# Patient Record
Sex: Female | Born: 1980 | Race: Black or African American | Hispanic: No | Marital: Single | Smoking: Never smoker
Health system: Southern US, Community
[De-identification: ages and names within clinical notes are randomized; demographics above are authoritative.]

## PROBLEM LIST (undated history)

## (undated) HISTORY — PX: FOOT SURGERY: SHX648

---

## 2008-12-30 ENCOUNTER — Emergency Department (HOSPITAL_COMMUNITY): Admission: EM | Admit: 2008-12-30 | Discharge: 2008-12-30 | Payer: Self-pay | Admitting: Emergency Medicine

## 2008-12-31 ENCOUNTER — Ambulatory Visit (HOSPITAL_COMMUNITY): Admission: RE | Admit: 2008-12-31 | Discharge: 2008-12-31 | Payer: Self-pay | Admitting: Emergency Medicine

## 2009-03-03 ENCOUNTER — Emergency Department (HOSPITAL_COMMUNITY): Admission: EM | Admit: 2009-03-03 | Discharge: 2009-03-03 | Payer: Self-pay | Admitting: Emergency Medicine

## 2009-06-01 ENCOUNTER — Emergency Department (HOSPITAL_COMMUNITY): Admission: EM | Admit: 2009-06-01 | Discharge: 2009-06-01 | Payer: Self-pay | Admitting: Emergency Medicine

## 2009-07-25 ENCOUNTER — Emergency Department (HOSPITAL_COMMUNITY): Admission: EM | Admit: 2009-07-25 | Discharge: 2009-07-25 | Payer: Self-pay | Admitting: Emergency Medicine

## 2009-07-26 ENCOUNTER — Emergency Department (HOSPITAL_COMMUNITY): Admission: EM | Admit: 2009-07-26 | Discharge: 2009-07-26 | Payer: Self-pay | Admitting: Emergency Medicine

## 2009-10-05 ENCOUNTER — Emergency Department (HOSPITAL_COMMUNITY): Admission: EM | Admit: 2009-10-05 | Discharge: 2009-10-05 | Payer: Self-pay | Admitting: Emergency Medicine

## 2009-10-19 ENCOUNTER — Emergency Department (HOSPITAL_COMMUNITY): Admission: EM | Admit: 2009-10-19 | Discharge: 2009-10-20 | Payer: Self-pay | Admitting: Emergency Medicine

## 2009-10-21 ENCOUNTER — Emergency Department (HOSPITAL_COMMUNITY): Admission: EM | Admit: 2009-10-21 | Discharge: 2009-10-21 | Payer: Self-pay | Admitting: Emergency Medicine

## 2009-12-30 ENCOUNTER — Emergency Department (HOSPITAL_COMMUNITY): Admission: EM | Admit: 2009-12-30 | Discharge: 2009-12-30 | Payer: Self-pay | Admitting: Emergency Medicine

## 2010-01-13 ENCOUNTER — Emergency Department (HOSPITAL_COMMUNITY): Admission: EM | Admit: 2010-01-13 | Discharge: 2010-01-13 | Payer: Self-pay | Admitting: Emergency Medicine

## 2010-02-25 ENCOUNTER — Emergency Department (HOSPITAL_COMMUNITY): Admission: EM | Admit: 2010-02-25 | Discharge: 2010-02-25 | Payer: Self-pay | Admitting: Emergency Medicine

## 2010-02-27 ENCOUNTER — Emergency Department (HOSPITAL_COMMUNITY): Admission: EM | Admit: 2010-02-27 | Discharge: 2010-02-27 | Payer: Self-pay | Admitting: Emergency Medicine

## 2010-08-18 ENCOUNTER — Encounter: Payer: Self-pay | Admitting: Emergency Medicine

## 2010-10-10 LAB — POCT PREGNANCY, URINE: Preg Test, Ur: NEGATIVE

## 2010-10-12 LAB — DIFFERENTIAL
Basophils Absolute: 0.1 K/uL (ref 0.0–0.1)
Basophils Relative: 2 % — ABNORMAL HIGH (ref 0–1)
Eosinophils Absolute: 0.1 K/uL (ref 0.0–0.7)
Eosinophils Relative: 1 % (ref 0–5)
Lymphocytes Relative: 32 % (ref 12–46)
Lymphs Abs: 2.5 K/uL (ref 0.7–4.0)
Monocytes Absolute: 0.5 K/uL (ref 0.1–1.0)
Monocytes Relative: 6 % (ref 3–12)
Neutro Abs: 4.9 K/uL (ref 1.7–7.7)
Neutrophils Relative %: 60 % (ref 43–77)

## 2010-10-12 LAB — URINALYSIS, ROUTINE W REFLEX MICROSCOPIC
Bilirubin Urine: NEGATIVE
Ketones, ur: NEGATIVE mg/dL
Nitrite: NEGATIVE
Protein, ur: NEGATIVE mg/dL
Specific Gravity, Urine: 1.02 (ref 1.005–1.030)

## 2010-10-12 LAB — COMPREHENSIVE METABOLIC PANEL WITH GFR
ALT: 21 U/L (ref 0–35)
AST: 17 U/L (ref 0–37)
Albumin: 3.8 g/dL (ref 3.5–5.2)
Alkaline Phosphatase: 75 U/L (ref 39–117)
BUN: 9 mg/dL (ref 6–23)
CO2: 22 meq/L (ref 19–32)
Calcium: 9.3 mg/dL (ref 8.4–10.5)
Chloride: 105 meq/L (ref 96–112)
Creatinine, Ser: 0.63 mg/dL (ref 0.4–1.2)
GFR calc non Af Amer: >60 mL/min
Glucose, Bld: 82 mg/dL (ref 70–99)
Potassium: 3.7 meq/L (ref 3.5–5.1)
Sodium: 136 meq/L (ref 135–145)
Total Bilirubin: 0.6 mg/dL (ref 0.3–1.2)
Total Protein: 8.2 g/dL (ref 6.0–8.3)

## 2010-10-12 LAB — CBC
HCT: 41.2 % (ref 36.0–46.0)
Hemoglobin: 14 g/dL (ref 12.0–15.0)
MCHC: 34 g/dL (ref 30.0–36.0)
MCV: 87 fL (ref 78.0–100.0)
Platelets: 234 K/uL (ref 150–400)
RBC: 4.73 MIL/uL (ref 3.87–5.11)
RDW: 14.1 % (ref 11.5–15.5)
WBC: 8.1 K/uL (ref 4.0–10.5)

## 2010-10-12 LAB — PREGNANCY, URINE: Preg Test, Ur: NEGATIVE

## 2010-10-13 LAB — URINALYSIS, ROUTINE W REFLEX MICROSCOPIC
Bilirubin Urine: NEGATIVE
Nitrite: NEGATIVE
Protein, ur: NEGATIVE mg/dL
Specific Gravity, Urine: 1.02 (ref 1.005–1.030)
Urobilinogen, UA: 0.2 mg/dL (ref 0.0–1.0)
pH: 5.5 (ref 5.0–8.0)

## 2010-10-13 LAB — COMPREHENSIVE METABOLIC PANEL
ALT: 20 U/L (ref 0–35)
Alkaline Phosphatase: 86 U/L (ref 39–117)
BUN: 8 mg/dL (ref 6–23)
Calcium: 9.1 mg/dL (ref 8.4–10.5)
GFR calc non Af Amer: >60 mL/min (ref >60)
Total Bilirubin: 0.6 mg/dL (ref 0.3–1.2)
Total Protein: 7.9 g/dL (ref 6.0–8.3)

## 2010-10-13 LAB — CBC
HCT: 42.7 % (ref 36.0–46.0)
Hemoglobin: 14.6 g/dL (ref 12.0–15.0)
Platelets: 220 10*3/uL (ref 150–400)
RBC: 4.92 MIL/uL (ref 3.87–5.11)
RDW: 13.8 % (ref 11.5–15.5)
WBC: 6.5 10*3/uL (ref 4.0–10.5)

## 2010-10-13 LAB — DIFFERENTIAL
Basophils Absolute: 0.1 10*3/uL (ref 0.0–0.1)
Basophils Relative: 1 % (ref 0–1)
Eosinophils Absolute: 0 10*3/uL (ref 0.0–0.7)
Eosinophils Relative: 1 % (ref 0–5)
Lymphocytes Relative: 32 % (ref 12–46)
Lymphs Abs: 2.1 10*3/uL (ref 0.7–4.0)
Monocytes Absolute: 0.3 10*3/uL (ref 0.1–1.0)
Monocytes Relative: 5 % (ref 3–12)

## 2010-10-20 LAB — URINALYSIS, ROUTINE W REFLEX MICROSCOPIC
Bilirubin Urine: NEGATIVE
Nitrite: NEGATIVE

## 2010-10-20 LAB — PREGNANCY, URINE: Preg Test, Ur: NEGATIVE

## 2010-10-27 LAB — DIFFERENTIAL
Lymphs Abs: 1.7 10*3/uL (ref 0.7–4.0)
Monocytes Relative: 5 % (ref 3–12)
Neutro Abs: 3.5 10*3/uL (ref 1.7–7.7)
Neutrophils Relative %: 63 % (ref 43–77)

## 2010-10-27 LAB — URINALYSIS, ROUTINE W REFLEX MICROSCOPIC
Glucose, UA: NEGATIVE mg/dL
Nitrite: NEGATIVE
Protein, ur: NEGATIVE mg/dL
Specific Gravity, Urine: 1.02 (ref 1.005–1.030)
Urobilinogen, UA: 0.2 mg/dL (ref 0.0–1.0)
pH: 6.5 (ref 5.0–8.0)

## 2010-10-27 LAB — BASIC METABOLIC PANEL
Calcium: 9.3 mg/dL (ref 8.4–10.5)
Chloride: 106 mEq/L (ref 96–112)
Creatinine, Ser: 0.69 mg/dL (ref 0.4–1.2)
GFR calc non Af Amer: >60 mL/min (ref >60)
Potassium: 3.8 mEq/L (ref 3.5–5.1)
Sodium: 137 mEq/L (ref 135–145)

## 2010-10-27 LAB — CBC
MCV: 86.7 fL (ref 78.0–100.0)
RBC: 4.95 MIL/uL (ref 3.87–5.11)
WBC: 5.6 10*3/uL (ref 4.0–10.5)

## 2010-11-01 LAB — URINE MICROSCOPIC-ADD ON

## 2010-11-01 LAB — URINALYSIS, ROUTINE W REFLEX MICROSCOPIC
Glucose, UA: NEGATIVE mg/dL
pH: 6 (ref 5.0–8.0)

## 2010-11-01 LAB — BASIC METABOLIC PANEL
BUN: 6 mg/dL (ref 6–23)
Calcium: 8.7 mg/dL (ref 8.4–10.5)
GFR calc non Af Amer: >60 mL/min (ref >60)
Glucose, Bld: 103 mg/dL — ABNORMAL HIGH (ref 70–99)

## 2010-11-01 LAB — CBC
HCT: 39.5 % (ref 36.0–46.0)
Platelets: 221 10*3/uL (ref 150–400)
RDW: 13.4 % (ref 11.5–15.5)

## 2010-11-01 LAB — DIFFERENTIAL
Basophils Absolute: 0 10*3/uL (ref 0.0–0.1)
Eosinophils Relative: 1 % (ref 0–5)
Lymphocytes Relative: 30 % (ref 12–46)
Neutro Abs: 3.8 10*3/uL (ref 1.7–7.7)

## 2010-11-03 LAB — LIPASE, BLOOD: Lipase: 22 U/L (ref 11–59)

## 2010-11-03 LAB — COMPREHENSIVE METABOLIC PANEL
ALT: 24 U/L (ref 0–35)
AST: 21 U/L (ref 0–37)
Albumin: 3.8 g/dL (ref 3.5–5.2)
CO2: 24 mEq/L (ref 19–32)
Chloride: 108 mEq/L (ref 96–112)
GFR calc Af Amer: >60 mL/min (ref >60)
GFR calc non Af Amer: >60 mL/min (ref >60)
Potassium: 3.8 mEq/L (ref 3.5–5.1)
Sodium: 138 mEq/L (ref 135–145)
Total Bilirubin: 0.6 mg/dL (ref 0.3–1.2)

## 2010-11-03 LAB — DIFFERENTIAL
Basophils Absolute: 0 10*3/uL (ref 0.0–0.1)
Eosinophils Absolute: 0 10*3/uL (ref 0.0–0.7)
Eosinophils Relative: 0 % (ref 0–5)
Monocytes Absolute: 0.2 10*3/uL (ref 0.1–1.0)

## 2010-11-03 LAB — URINALYSIS, ROUTINE W REFLEX MICROSCOPIC
Bilirubin Urine: NEGATIVE
Ketones, ur: NEGATIVE mg/dL
Nitrite: NEGATIVE
Protein, ur: NEGATIVE mg/dL
Specific Gravity, Urine: 1.025 (ref 1.005–1.030)
Urobilinogen, UA: 0.2 mg/dL (ref 0.0–1.0)

## 2010-11-03 LAB — URINE CULTURE: Colony Count: >=100000

## 2010-11-03 LAB — CBC
RBC: 4.46 MIL/uL (ref 3.87–5.11)
WBC: 7.6 10*3/uL (ref 4.0–10.5)

## 2010-11-03 LAB — URINE MICROSCOPIC-ADD ON

## 2010-11-03 LAB — PREGNANCY, URINE: Preg Test, Ur: NEGATIVE

## 2010-11-16 ENCOUNTER — Emergency Department (HOSPITAL_COMMUNITY): Payer: Medicaid Other

## 2010-11-16 ENCOUNTER — Emergency Department (HOSPITAL_COMMUNITY)
Admission: EM | Admit: 2010-11-16 | Discharge: 2010-11-16 | Disposition: A | Discharge status: Home / Self Care | Payer: Medicaid Other | Attending: Emergency Medicine | Admitting: Emergency Medicine

## 2010-11-16 DIAGNOSIS — N898 Other specified noninflammatory disorders of vagina: Secondary | ICD-10-CM | POA: Insufficient documentation

## 2010-11-16 DIAGNOSIS — R1012 Left upper quadrant pain: Secondary | ICD-10-CM | POA: Insufficient documentation

## 2010-11-16 DIAGNOSIS — J45909 Unspecified asthma, uncomplicated: Secondary | ICD-10-CM | POA: Insufficient documentation

## 2010-11-16 DIAGNOSIS — R1032 Left lower quadrant pain: Secondary | ICD-10-CM | POA: Insufficient documentation

## 2010-11-16 DIAGNOSIS — G43909 Migraine, unspecified, not intractable, without status migrainosus: Secondary | ICD-10-CM | POA: Insufficient documentation

## 2010-11-16 LAB — WET PREP, GENITAL
Clue Cells Wet Prep HPF POC: NONE SEEN
Trich, Wet Prep: NONE SEEN
Yeast Wet Prep HPF POC: NONE SEEN

## 2010-11-16 LAB — COMPREHENSIVE METABOLIC PANEL
Alkaline Phosphatase: 82 U/L (ref 39–117)
BUN: 7 mg/dL (ref 6–23)
Calcium: 9.2 mg/dL (ref 8.4–10.5)
Creatinine, Ser: 0.65 mg/dL (ref 0.4–1.2)
Glucose, Bld: 97 mg/dL (ref 70–99)
Potassium: 3.9 mEq/L (ref 3.5–5.1)
Total Protein: 7.4 g/dL (ref 6.0–8.3)

## 2010-11-16 LAB — DIFFERENTIAL
Basophils Relative: 0 % (ref 0–1)
Eosinophils Absolute: 0.1 10*3/uL (ref 0.0–0.7)
Eosinophils Relative: 1 % (ref 0–5)
Lymphs Abs: 2.6 10*3/uL (ref 0.7–4.0)
Monocytes Relative: 8 % (ref 3–12)
Neutrophils Relative %: 56 % (ref 43–77)

## 2010-11-16 LAB — URINALYSIS, ROUTINE W REFLEX MICROSCOPIC
Bilirubin Urine: NEGATIVE
Nitrite: NEGATIVE
Specific Gravity, Urine: 1.02 (ref 1.005–1.030)
Urobilinogen, UA: 0.2 mg/dL (ref 0.0–1.0)
pH: 6 (ref 5.0–8.0)

## 2010-11-16 LAB — URINE MICROSCOPIC-ADD ON

## 2010-11-16 LAB — PREGNANCY, URINE: Preg Test, Ur: NEGATIVE

## 2010-11-16 LAB — CBC
MCH: 29.3 pg (ref 26.0–34.0)
MCHC: 33.8 g/dL (ref 30.0–36.0)
MCV: 86.5 fL (ref 78.0–100.0)
Platelets: 256 10*3/uL (ref 150–400)
RBC: 4.68 MIL/uL (ref 3.87–5.11)
RDW: 14.1 % (ref 11.5–15.5)

## 2010-11-16 LAB — RPR: RPR Ser Ql: NONREACTIVE

## 2011-06-20 ENCOUNTER — Encounter: Payer: Self-pay | Admitting: *Deleted

## 2011-06-20 ENCOUNTER — Emergency Department (HOSPITAL_COMMUNITY)
Admission: EM | Admit: 2011-06-20 | Discharge: 2011-06-20 | Disposition: A | Discharge status: Home / Self Care | Payer: Worker's Compensation | Attending: Emergency Medicine | Admitting: Emergency Medicine

## 2011-06-20 DIAGNOSIS — M545 Low back pain, unspecified: Secondary | ICD-10-CM | POA: Insufficient documentation

## 2011-06-20 DIAGNOSIS — J45909 Unspecified asthma, uncomplicated: Secondary | ICD-10-CM | POA: Insufficient documentation

## 2011-06-20 DIAGNOSIS — X500XXA Overexertion from strenuous movement or load, initial encounter: Secondary | ICD-10-CM | POA: Insufficient documentation

## 2011-06-20 DIAGNOSIS — Y9269 Other specified industrial and construction area as the place of occurrence of the external cause: Secondary | ICD-10-CM | POA: Insufficient documentation

## 2011-06-20 MED ORDER — CYCLOBENZAPRINE HCL 5 MG PO TABS: 5.0000 mg | ORAL_TABLET | Freq: Three times a day (TID) | ORAL | Status: AC | PRN

## 2011-06-20 MED ORDER — HYDROCODONE-ACETAMINOPHEN 5-325 MG PO TABS: 2.0000 | ORAL_TABLET | ORAL | Status: AC | PRN

## 2011-06-20 NOTE — ED Provider Notes (Signed)
History     CSN: [id number] Arrival date & time: 06/20/2011 12:34 PM   None     Chief Complaint  Patient presents with  . Back Pain    HPI Kerri Fisher is a 30 y.o. female who presents to the ED for back pain. 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  Continued to work went home and began feeling low back pain so took a warm shower and used a heating pad that helped and took ibuprofen. Today went back to work and had the same resident and after trying to turn her began having sharp pain in lower back. Denies loss of control of bladder or bowels. The history was provided by the patient.   Past Medical History  Diagnosis Date  . Asthma   . Migraine     History reviewed. No pertinent past surgical history.  History reviewed. No pertinent family history.  History  Substance Use Topics  . Smoking status: Never Smoker   . Smokeless tobacco: Not on file  . Alcohol Use: No    OB History    Grav Para Term Preterm Abortions TAB SAB Ect Mult Living                  Review of Systems  Musculoskeletal: Positive for back pain.  All other systems reviewed and are negative.    Allergies  Penicillins  Home Medications  No current outpatient prescriptions on file.  BP 127/75  Pulse 92  Temp(Src) 97.7 F (36.5 C) (Oral)  Resp 20  Ht 5\' 6"  (1.676 m)  Wt 234 lb (106.142 kg)  BMI 37.77 kg/m2  SpO2 99%  Physical Exam  Nursing note and vitals reviewed. Constitutional: She is oriented to person, place, and time. She appears well-developed and well-nourished. No distress.  HENT:  Head: Normocephalic and atraumatic.  Eyes: EOM are normal.  Neck: Neck supple.  Cardiovascular: Normal rate.   Pulmonary/Chest: Effort normal.  Abdominal: Soft. There is no tenderness.  Musculoskeletal:       Pain in lower lumbar area with ROM. Pain is bilateral and radiates to both legs.  Reflexes are equal in lower extremities. Pedal pulses +. Normal gait. Good grips and equal bilateral.  Neurological: She is alert and oriented to person, place, and time. No cranial nerve deficit.  Skin: Skin is warm and dry.  Psychiatric: She has a normal mood and affect. Her behavior is normal. Judgment and thought content normal.   Assessment: Low back pain  Plan:  Rest, heat, ibuprofen, flexeril and vicodin   Follow up with Dr. Romeo Apple   Return here as needed. ED Course  Procedures    MDM          Kerrie Buffalo, NP 06/20/11 1404

## 2011-06-20 NOTE — ED Notes (Signed)
Pt states she was at work yesterday and working with a pt and felt a pop in her lower back. Pt states she kept working and last night began to feel worse. Pt c/o sharp pain today.

## 2011-06-20 NOTE — ED Notes (Signed)
Pt a/ox4. Resp even and unlabored. NAD at this time. D/C instructions and Rx x2 reviewed with pt. Pt verbalized understanding. Pt ambulated to lobby with steady gate.  

## 2011-06-21 NOTE — ED Provider Notes (Signed)
Medical screening examination/treatment/procedure(s) were performed by non-physician practitioner and as supervising physician I was immediately available for consultation/collaboration.  Nicoletta Dress. Colon Branch, MD 06/21/11 1215

## 2011-07-02 ENCOUNTER — Emergency Department (HOSPITAL_COMMUNITY): Payer: Medicaid Other

## 2011-07-02 ENCOUNTER — Emergency Department (HOSPITAL_COMMUNITY)
Admission: EM | Admit: 2011-07-02 | Discharge: 2011-07-02 | Disposition: A | Discharge status: Home / Self Care | Payer: Medicaid Other | Attending: Emergency Medicine | Admitting: Emergency Medicine

## 2011-07-02 ENCOUNTER — Encounter (HOSPITAL_COMMUNITY): Payer: Self-pay | Admitting: Emergency Medicine

## 2011-07-02 DIAGNOSIS — R1013 Epigastric pain: Secondary | ICD-10-CM | POA: Insufficient documentation

## 2011-07-02 DIAGNOSIS — K5289 Other specified noninfective gastroenteritis and colitis: Secondary | ICD-10-CM | POA: Insufficient documentation

## 2011-07-02 DIAGNOSIS — K529 Noninfective gastroenteritis and colitis, unspecified: Secondary | ICD-10-CM

## 2011-07-02 DIAGNOSIS — J45909 Unspecified asthma, uncomplicated: Secondary | ICD-10-CM | POA: Insufficient documentation

## 2011-07-02 DIAGNOSIS — G43909 Migraine, unspecified, not intractable, without status migrainosus: Secondary | ICD-10-CM | POA: Insufficient documentation

## 2011-07-02 LAB — URINALYSIS, ROUTINE W REFLEX MICROSCOPIC
Glucose, UA: NEGATIVE mg/dL
Hgb urine dipstick: NEGATIVE
Protein, ur: NEGATIVE mg/dL

## 2011-07-02 LAB — DIFFERENTIAL
Lymphs Abs: 1.2 10*3/uL (ref 0.7–4.0)
Monocytes Relative: 6 % (ref 3–12)
Neutro Abs: 4.6 10*3/uL (ref 1.7–7.7)
Neutrophils Relative %: 74 % (ref 43–77)

## 2011-07-02 LAB — CBC
Hemoglobin: 14.3 g/dL (ref 12.0–15.0)
RBC: 4.81 MIL/uL (ref 3.87–5.11)

## 2011-07-02 LAB — COMPREHENSIVE METABOLIC PANEL
Alkaline Phosphatase: 96 U/L (ref 39–117)
BUN: 8 mg/dL (ref 6–23)
Chloride: 103 mEq/L (ref 96–112)
GFR calc Af Amer: >90 mL/min (ref >90)
Glucose, Bld: 126 mg/dL — ABNORMAL HIGH (ref 70–99)
Potassium: 3.7 mEq/L (ref 3.5–5.1)
Total Bilirubin: 0.3 mg/dL (ref 0.3–1.2)

## 2011-07-02 LAB — PREGNANCY, URINE: Preg Test, Ur: NEGATIVE

## 2011-07-02 LAB — POCT PREGNANCY, URINE: Preg Test, Ur: NEGATIVE

## 2011-07-02 LAB — LIPASE, BLOOD: Lipase: 25 U/L (ref 11–59)

## 2011-07-02 MED ORDER — DIPHENOXYLATE-ATROPINE 2.5-0.025 MG PO TABS: 1.0000 | ORAL_TABLET | Freq: Four times a day (QID) | ORAL | Status: AC | PRN

## 2011-07-02 MED ORDER — PANTOPRAZOLE SODIUM 40 MG IV SOLR
40.0000 mg | Freq: Once | INTRAVENOUS | Status: AC
  Administered 2011-07-02: 40 mg via INTRAVENOUS
  Filled 2011-07-02: qty 40

## 2011-07-02 MED ORDER — MORPHINE SULFATE 4 MG/ML IJ SOLN
4.0000 mg | Freq: Once | INTRAMUSCULAR | Status: AC
  Administered 2011-07-02: 4 mg via INTRAVENOUS
  Filled 2011-07-02: qty 1

## 2011-07-02 MED ORDER — SODIUM CHLORIDE 0.9 % IV SOLN
INTRAVENOUS | Status: DC
  Administered 2011-07-02: 11:00:00 via INTRAVENOUS

## 2011-07-02 MED ORDER — ONDANSETRON HCL 4 MG/2ML IJ SOLN
4.0000 mg | Freq: Once | INTRAMUSCULAR | Status: AC
  Administered 2011-07-02: 4 mg via INTRAVENOUS
  Filled 2011-07-02: qty 2

## 2011-07-02 MED ORDER — SODIUM CHLORIDE 0.9 % IV BOLUS (SEPSIS)
1000.0000 mL | Freq: Once | INTRAVENOUS | Status: AC
  Administered 2011-07-02: 1000 mL via INTRAVENOUS

## 2011-07-02 MED ORDER — MORPHINE SULFATE 4 MG/ML IJ SOLN: 4.0000 mg | Freq: Once | INTRAMUSCULAR | Status: DC

## 2011-07-02 MED ORDER — HYDROCODONE-ACETAMINOPHEN 5-325 MG PO TABS: 1.0000 | ORAL_TABLET | ORAL | Status: DC | PRN

## 2011-07-02 MED ORDER — ONDANSETRON HCL 4 MG PO TABS: 4.0000 mg | ORAL_TABLET | Freq: Four times a day (QID) | ORAL | Status: AC

## 2011-07-02 MED ORDER — IOHEXOL 300 MG/ML  SOLN
100.0000 mL | Freq: Once | INTRAMUSCULAR | Status: AC | PRN
  Administered 2011-07-02: 100 mL via INTRAVENOUS

## 2011-07-02 NOTE — ED Notes (Signed)
Received report from Tarri Glenn, RN

## 2011-07-02 NOTE — ED Notes (Signed)
Patient requesting something light to each "crackers" advised will need to check with MD

## 2011-07-02 NOTE — ED Provider Notes (Signed)
This chart was scribed for Donnetta Hutching, MD by Wallis Mart. The patient was seen in room APA04/APA04 and the patient's care was started at 7:44AM.   CSN: [id number] Arrival date & time: 07/02/2011  6:33 AM   None     Chief Complaint  Patient presents with  . Emesis    (Consider location/radiation/quality/duration/timing/severity/associated sxs/prior treatment) Patient is a 30 y.o. female presenting with vomiting.  Emesis   Kerri Fisher is a 30 y.o. female who presents to the Emergency Department complaining of constant moderate abdominal pain located in the epigastrium.  Pt reports that the pain started yesterday around 4:30 pm while putting up the Christmas tree at her home.  Pt c/o associated diarrhea, vomiting, HA and trouble sleeping.  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  Pt reports that she ate shrimp at a work reception yesterday afternoon.         Past Medical History  Diagnosis Date  . Asthma   . Migraine     History reviewed. No pertinent past surgical history.  No family history on file.  History  Substance Use Topics  . Smoking status: Never Smoker   . Smokeless tobacco: Not on file  . Alcohol Use: No    OB History    Grav Para Term Preterm Abortions TAB SAB Ect Mult Living                  Review of Systems  Gastrointestinal: Positive for vomiting.  10 Systems reviewed and are negative for acute change except as noted in the HPI.   Allergies  Penicillins  Home Medications   Current Outpatient Rx  Name Route Sig Dispense Refill  . ALBUTEROL SULFATE HFA 108 (90 BASE) MCG/ACT IN AERS Inhalation Inhale 1 puff into the lungs every 6 (six) hours as needed. For asthma     . CYCLOBENZAPRINE HCL 10 MG PO TABS Oral Take 10 mg by mouth at bedtime.      Marland Kitchen HYDROCODONE-ACETAMINOPHEN 5-325 MG PO TABS Oral Take 1 tablet by mouth every 6 (six) hours as needed.      Marland Kitchen MEDROXYPROGESTERONE ACETATE 150 MG/ML IM SUSP Intramuscular Inject  150 mg into the muscle every 3 (three) months.        BP 103/68  Pulse 94  Temp(Src) 97.4 F (36.3 C) (Oral)  Resp 20  Ht 5\' 6"  (1.676 m)  Wt 230 lb (104.327 kg)  BMI 37.12 kg/m2  SpO2 100%  Physical Exam  Nursing note and vitals reviewed. Constitutional: She is oriented to person, place, and time. She appears well-developed and well-nourished. No distress.  HENT:  Head: Normocephalic and atraumatic.  Eyes: EOM are normal. Pupils are equal, round, and reactive to light.  Neck: Neck supple. No tracheal deviation present.  Cardiovascular: Normal rate and regular rhythm.   No murmur heard. Pulmonary/Chest: Effort normal. No respiratory distress. She has no wheezes.  Abdominal: Soft. She exhibits no distension. There is tenderness.       Minimal tenderenss in upper epigastrium   Musculoskeletal: Normal range of motion. She exhibits no edema.  Neurological: She is alert and oriented to person, place, and time. No sensory deficit.  Skin: Skin is warm and dry.  Psychiatric: She has a normal mood and affect. Her behavior is normal.    ED Course  Procedures (including critical care time)  DIAGNOSTIC STUDIES: Oxygen Saturation is 100% on room air, normal by my interpretation.    COORDINATION OF  CARE: 7:49AM- Discussed the treatment plan with patient which will consist of IVFs.  Labs Reviewed  COMPREHENSIVE METABOLIC PANEL - Abnormal; Notable for the following:    Glucose, Bld 126 (*)    All other components within normal limits  URINALYSIS, ROUTINE W REFLEX MICROSCOPIC  PREGNANCY, URINE  POCT PREGNANCY, URINE  CBC  DIFFERENTIAL  LIPASE, BLOOD   No results found.   No diagnosis found.  Results for orders placed during the hospital encounter of 07/02/11  URINALYSIS, ROUTINE W REFLEX MICROSCOPIC      Component Value Range   Color, Urine YELLOW  YELLOW    APPearance CLEAR  CLEAR    Specific Gravity, Urine 1.020  1.005 - 1.030    pH 6.0  5.0 - 8.0    Glucose, UA  NEGATIVE  NEGATIVE (mg/dL)   Hgb urine dipstick NEGATIVE  NEGATIVE    Bilirubin Urine NEGATIVE  NEGATIVE    Ketones, ur NEGATIVE  NEGATIVE (mg/dL)   Protein, ur NEGATIVE  NEGATIVE (mg/dL)   Urobilinogen, UA 0.2  0.0 - 1.0 (mg/dL)   Nitrite NEGATIVE  NEGATIVE    Leukocytes, UA NEGATIVE  NEGATIVE   PREGNANCY, URINE      Component Value Range   Preg Test, Ur NEGATIVE    POCT PREGNANCY, URINE      Component Value Range   Preg Test, Ur NEGATIVE    CBC      Component Value Range   WBC 6.2  4.0 - 10.5 (K/uL)   RBC 4.81  3.87 - 5.11 (MIL/uL)   Hemoglobin 14.3  12.0 - 15.0 (g/dL)   HCT 16.1  09.6 - 04.5 (%)   MCV 86.7  78.0 - 100.0 (fL)   MCH 29.7  26.0 - 34.0 (pg)   MCHC 34.3  30.0 - 36.0 (g/dL)   RDW 40.9  81.1 - 91.4 (%)   Platelets 259  150 - 400 (K/uL)  DIFFERENTIAL      Component Value Range   Neutrophils Relative 74  43 - 77 (%)   Neutro Abs 4.6  1.7 - 7.7 (K/uL)   Lymphocytes Relative 19  12 - 46 (%)   Lymphs Abs 1.2  0.7 - 4.0 (K/uL)   Monocytes Relative 6  3 - 12 (%)   Monocytes Absolute 0.4  0.1 - 1.0 (K/uL)   Eosinophils Relative 1  0 - 5 (%)   Eosinophils Absolute 0.1  0.0 - 0.7 (K/uL)   Basophils Relative 0  0 - 1 (%)   Basophils Absolute 0.0  0.0 - 0.1 (K/uL)  COMPREHENSIVE METABOLIC PANEL      Component Value Range   Sodium 137  135 - 145 (mEq/L)   Potassium 3.7  3.5 - 5.1 (mEq/L)   Chloride 103  96 - 112 (mEq/L)   CO2 26  19 - 32 (mEq/L)   Glucose, Bld 126 (*) 70 - 99 (mg/dL)   BUN 8  6 - 23 (mg/dL)   Creatinine, Ser 7.82  0.50 - 1.10 (mg/dL)   Calcium 9.2  8.4 - 95.6 (mg/dL)   Total Protein 7.8  6.0 - 8.3 (g/dL)   Albumin 3.5  3.5 - 5.2 (g/dL)   AST 14  0 - 37 (U/L)   ALT 22  0 - 35 (U/L)   Alkaline Phosphatase 96  39 - 117 (U/L)   Total Bilirubin 0.3  0.3 - 1.2 (mg/dL)   GFR calc non Af Amer >90  >90 (mL/min)   GFR calc Af Amer >90  >  90 (mL/min)  LIPASE, BLOOD      Component Value Range   Lipase 25  11 - 59 (U/L)   Ct Abdomen Pelvis W  Contrast  07/02/2011  *RADIOLOGY REPORT*  Clinical Data: Persistent epigastric pain  CT ABDOMEN AND PELVIS WITH CONTRAST  Technique:  Multidetector CT imaging of the abdomen and pelvis was performed following the standard protocol during bolus administration of intravenous contrast.  Contrast: OMNIPAQUE IOHEXOL 300 MG/ML IV SOLN  Comparison: CT abdomen 01/13/2010  Findings: Lung bases are clear.  No pericardial fluid.  No focal hepatic lesion.  The gallbladder, pancreas, spleen, and adrenal glands are normal.  There are bilateral nephrolithiasis.  The stones measure approximately 3 mm each and number approximately 6 to 8 stones within each kidney.  No evidence of ureterolithiasis. Calcifications  adjacent to the distal ureters are unchanged from prior felt to be external to the ureters.  The number of calculi in the kidneys may be slightly increased.  The stomach, small bowel, appendix, and colon are normal.  Abdominal aorta is normal caliber.  No retroperitoneal or periportal lymphadenopathy.  No free fluid the pelvis.  The bladder is normal.  Bladder stones. No pelvic lymphadenopathy. Uterus is normal.  No evidence of axillary, supraclavicular, mediastinal, or hilar lymphadenopathy.  IMPRESSION:  1.  No acute abdominal or pelvic findings. 2.  Normal appendix. 3.  Bilateral nephrolithiasis which is increased from prior.  No ureterolithiasis or obstructive uropathy.  Original Report Authenticated By: Genevive Bi, M.D.     MDM  History and physical most consistent with gastroenteritis.  No acute abdomen. CT scan shows no acute anomaly.  Patient feeling better after hydration, pain meds, Zofran     I personally performed the services described in this documentation, which was scribed in my presence. The recorded information has been reviewed and considered.     Donnetta Hutching, MD 07/02/11 [phone]

## 2011-07-02 NOTE — ED Notes (Signed)
x2 unsuccessful IV attempts made. Kendal Hymen RN to room to try for IV access.

## 2011-07-02 NOTE — ED Notes (Signed)
Patient c/o vomiting since yesterday.

## 2011-07-11 ENCOUNTER — Encounter (HOSPITAL_COMMUNITY): Payer: Self-pay | Admitting: Emergency Medicine

## 2011-07-11 ENCOUNTER — Emergency Department (HOSPITAL_COMMUNITY)
Admission: EM | Admit: 2011-07-11 | Discharge: 2011-07-11 | Disposition: A | Discharge status: Home / Self Care | Payer: Medicaid Other | Attending: Emergency Medicine | Admitting: Emergency Medicine

## 2011-07-11 ENCOUNTER — Emergency Department (HOSPITAL_COMMUNITY): Payer: Medicaid Other

## 2011-07-11 DIAGNOSIS — R197 Diarrhea, unspecified: Secondary | ICD-10-CM | POA: Insufficient documentation

## 2011-07-11 DIAGNOSIS — R109 Unspecified abdominal pain: Secondary | ICD-10-CM | POA: Insufficient documentation

## 2011-07-11 DIAGNOSIS — R112 Nausea with vomiting, unspecified: Secondary | ICD-10-CM | POA: Insufficient documentation

## 2011-07-11 DIAGNOSIS — J45909 Unspecified asthma, uncomplicated: Secondary | ICD-10-CM | POA: Insufficient documentation

## 2011-07-11 LAB — POCT PREGNANCY, URINE: Preg Test, Ur: NEGATIVE

## 2011-07-11 LAB — URINALYSIS, ROUTINE W REFLEX MICROSCOPIC
Bilirubin Urine: NEGATIVE
Glucose, UA: NEGATIVE mg/dL
Hgb urine dipstick: NEGATIVE
Ketones, ur: NEGATIVE mg/dL
Protein, ur: NEGATIVE mg/dL
pH: 6.5 (ref 5.0–8.0)

## 2011-07-11 LAB — COMPREHENSIVE METABOLIC PANEL
Albumin: 3.3 g/dL — ABNORMAL LOW (ref 3.5–5.2)
Alkaline Phosphatase: 97 U/L (ref 39–117)
BUN: 8 mg/dL (ref 6–23)
Chloride: 104 mEq/L (ref 96–112)
Creatinine, Ser: 0.64 mg/dL (ref 0.50–1.10)
GFR calc Af Amer: >90 mL/min (ref >90)
Glucose, Bld: 102 mg/dL — ABNORMAL HIGH (ref 70–99)
Potassium: 3.7 mEq/L (ref 3.5–5.1)
Total Bilirubin: 0.3 mg/dL (ref 0.3–1.2)
Total Protein: 8.1 g/dL (ref 6.0–8.3)

## 2011-07-11 LAB — DIFFERENTIAL
Basophils Relative: 0 % (ref 0–1)
Eosinophils Absolute: 0.2 10*3/uL (ref 0.0–0.7)
Lymphs Abs: 2.4 10*3/uL (ref 0.7–4.0)
Monocytes Absolute: 0.5 10*3/uL (ref 0.1–1.0)
Monocytes Relative: 8 % (ref 3–12)
Neutrophils Relative %: 53 % (ref 43–77)

## 2011-07-11 LAB — CBC
HCT: 41.4 % (ref 36.0–46.0)
Hemoglobin: 14.2 g/dL (ref 12.0–15.0)
MCH: 29.6 pg (ref 26.0–34.0)
MCHC: 34.3 g/dL (ref 30.0–36.0)
RBC: 4.79 MIL/uL (ref 3.87–5.11)

## 2011-07-11 LAB — URINE MICROSCOPIC-ADD ON

## 2011-07-11 MED ORDER — ACETAMINOPHEN 500 MG PO TABS
1000.0000 mg | ORAL_TABLET | Freq: Once | ORAL | Status: AC
  Administered 2011-07-11: 1000 mg via ORAL
  Filled 2011-07-11: qty 2

## 2011-07-11 MED ORDER — PROMETHAZINE HCL 25 MG PO TABS: 25.0000 mg | ORAL_TABLET | Freq: Four times a day (QID) | ORAL | Status: AC | PRN

## 2011-07-11 MED ORDER — ONDANSETRON HCL 4 MG/2ML IJ SOLN
4.0000 mg | INTRAMUSCULAR | Status: DC | PRN
  Administered 2011-07-11: 4 mg via INTRAVENOUS
  Filled 2011-07-11 (×2): qty 2

## 2011-07-11 MED ORDER — FAMOTIDINE IN NACL 20-0.9 MG/50ML-% IV SOLN
20.0000 mg | Freq: Once | INTRAVENOUS | Status: AC
  Administered 2011-07-11: 20 mg via INTRAVENOUS
  Filled 2011-07-11: qty 50

## 2011-07-11 MED ORDER — SODIUM CHLORIDE 0.9 % IV SOLN
INTRAVENOUS | Status: DC
  Administered 2011-07-11: 1000 mL via INTRAVENOUS

## 2011-07-11 MED ORDER — GI COCKTAIL ~~LOC~~
30.0000 mL | Freq: Once | ORAL | Status: AC
  Administered 2011-07-11: 30 mL via ORAL
  Filled 2011-07-11: qty 30

## 2011-07-11 MED ORDER — IBUPROFEN 400 MG PO TABS
400.0000 mg | ORAL_TABLET | Freq: Once | ORAL | Status: AC
  Administered 2011-07-11: 400 mg via ORAL
  Filled 2011-07-11: qty 1

## 2011-07-11 NOTE — ED Notes (Signed)
Patient with no complaints at this time. Respirations even and unlabored. Skin warm/dry. Discharge instructions reviewed with patient at this time. Patient given opportunity to voice concerns/ask questions. IV removed per policy and band-aid applied to site. Patient discharged at this time and left Emergency Department with steady gait.  

## 2011-07-11 NOTE — ED Notes (Signed)
Pt c/o upper abd pain with n/v/d since Thursday.

## 2011-07-11 NOTE — ED Notes (Signed)
Pt states that the nausea is better but the pain is the same. Pt rates the pain at a 9/10. RN made aware. NAD noted at this time. Vital signs WNL.

## 2011-07-11 NOTE — ED Provider Notes (Signed)
This chart was scribed for Kerri Anger, DO by Wallis Mart. The patient was seen in room APA05/APA05 and the patient's care was started at 8:59 AM.    CSN: [id number] Arrival date & time: 07/11/2011  8:32 AM   Chief Complaint  Patient presents with  . Abdominal Pain  . Nausea  . Emesis  . Diarrhea    HPI Pt seen at 8:59 AM Kerri Fisher is a 30 y.o. female who presents to the Emergency Department complaining of gradual onset and persistence of constant upper abodminal "pain" x2 days.  Has been associated with several intermittent episodes of N/V/D.  Pt was recently eval in the ED 2 weeks ago for same symptoms, labs and CT A/P neg for acute process.  Pt states her symptoms improved from that ED visit but have returned.  Denies fevers, no rash, no back pain, no black or blood in stools or emesis, no CP/SOB.    Past Medical History  Diagnosis Date  . Asthma   . Migraine     History reviewed. No pertinent past surgical history.   History  Substance Use Topics  . Smoking status: Never Smoker   . Smokeless tobacco: Not on file  . Alcohol Use: No   Review of Systems ROS: Statement: All systems negative except as marked or noted in the HPI; Constitutional: Negative for fever and chills. ; ; Eyes: Negative for eye pain, redness and discharge. ; ; ENMT: Negative for ear pain, hoarseness, nasal congestion, sinus pressure and sore throat. ; ; Cardiovascular: Negative for chest pain, palpitations, diaphoresis, dyspnea and peripheral edema. ; ; Respiratory: Negative for cough, wheezing and stridor. ; ; Gastrointestinal: +nausea, vomiting, diarrhea, abdominal pain; negative for blood in stool, hematemesis, jaundice and rectal bleeding. . ; ; Genitourinary: Negative for dysuria, flank pain and hematuria. ; ; Musculoskeletal: Negative for back pain and neck pain. Negative for swelling and trauma.; ; Skin: Negative for pruritus, rash, abrasions, blisters, bruising and skin lesion.; ;  Neuro: Negative for headache, lightheadedness and neck stiffness. Negative for weakness, altered level of consciousness , altered mental status, extremity weakness, paresthesias, involuntary movement, seizure and syncope.     Allergies  Tomato; Food; Peanut-containing drug products; and Penicillins  Home Medications   Current Outpatient Rx  Name Route Sig Dispense Refill  . ALBUTEROL SULFATE HFA 108 (90 BASE) MCG/ACT IN AERS Inhalation Inhale 1 puff into the lungs every 6 (six) hours as needed. For asthma    . B-12 2000 MCG PO TABS Oral Take 1 tablet by mouth every morning.      . CYCLOBENZAPRINE HCL 10 MG PO TABS Oral Take 10 mg by mouth at bedtime.     Marland Kitchen DIPHENOXYLATE-ATROPINE 2.5-0.025 MG PO TABS Oral Take 1 tablet by mouth 4 (four) times daily as needed for diarrhea or loose stools. 15 tablet 0  . HYDROCODONE-ACETAMINOPHEN 5-325 MG PO TABS Oral Take 1-2 tablets by mouth every 4 (four) hours as needed. For pain    . HYDROCODONE-ACETAMINOPHEN 5-325 MG PO TABS Oral Take 1-2 tablets by mouth every 4 (four) hours as needed for pain. 15 tablet 0  . MEDROXYPROGESTERONE ACETATE 150 MG/ML IM SUSP Intramuscular Inject 150 mg into the muscle every 3 (three) months.      Carma Leaven M PLUS PO TABS Oral Take 1 tablet by mouth daily.        BP 118/68  Pulse 106  Temp(Src) 97.7 F (36.5 C) (Oral)  Resp 21  Ht 5\' 6"  (  1.676 m)  Wt 232 lb (105.235 kg)  BMI 37.45 kg/m2  SpO2 98%  Physical Exam 0900: Physical examination:  Nursing notes reviewed; Vital signs and O2 SAT reviewed;  Constitutional: Well developed, Well nourished, Well hydrated, In no acute distress; Head:  Normocephalic, atraumatic; Eyes: EOMI, PERRL, No scleral icterus; ENMT: Mouth and pharynx normal, Mucous membranes moist; Neck: Supple, Full range of motion, No lymphadenopathy; Cardiovascular: Regular rate and rhythm, No murmur, rub, or gallop; Respiratory: Breath sounds clear & equal bilaterally, No rales, rhonchi, wheezes, or rub,  Normal respiratory effort/excursion; Chest: Nontender, Movement normal; Abdomen: Soft, +mid-epigastric area tender to palp.  No rebound or guarding, Nondistended, Normal bowel sounds; Extremities: Pulses normal, No tenderness, No edema, No calf edema or asymmetry.; Neuro: AA&Ox3, Major CN grossly intact.  No gross focal motor or sensory deficits in extremities.; Skin: Color normal, Warm, Dry.    ED Course  Procedures   MDM  MDM Reviewed: previous chart, nursing note and vitals Reviewed previous: CT scan and labs Interpretation: labs and x-ray   Results for orders placed during the hospital encounter of 07/11/11  URINALYSIS, ROUTINE W REFLEX MICROSCOPIC      Component Value Range   Color, Urine YELLOW  YELLOW    APPearance CLEAR  CLEAR    Specific Gravity, Urine 1.010  1.005 - 1.030    pH 6.5  5.0 - 8.0    Glucose, UA NEGATIVE  NEGATIVE (mg/dL)   Hgb urine dipstick NEGATIVE  NEGATIVE    Bilirubin Urine NEGATIVE  NEGATIVE    Ketones, ur NEGATIVE  NEGATIVE (mg/dL)   Protein, ur NEGATIVE  NEGATIVE (mg/dL)   Urobilinogen, UA 0.2  0.0 - 1.0 (mg/dL)   Nitrite NEGATIVE  NEGATIVE    Leukocytes, UA TRACE (*) NEGATIVE   LIPASE, BLOOD      Component Value Range   Lipase 26  11 - 59 (U/L)  CBC      Component Value Range   WBC 6.7  4.0 - 10.5 (K/uL)   RBC 4.79  3.87 - 5.11 (MIL/uL)   Hemoglobin 14.2  12.0 - 15.0 (g/dL)   HCT 16.1  09.6 - 04.5 (%)   MCV 86.4  78.0 - 100.0 (fL)   MCH 29.6  26.0 - 34.0 (pg)   MCHC 34.3  30.0 - 36.0 (g/dL)   RDW 40.9  81.1 - 91.4 (%)   Platelets 269  150 - 400 (K/uL)  DIFFERENTIAL      Component Value Range   Neutrophils Relative 53  43 - 77 (%)   Neutro Abs 3.6  1.7 - 7.7 (K/uL)   Lymphocytes Relative 36  12 - 46 (%)   Lymphs Abs 2.4  0.7 - 4.0 (K/uL)   Monocytes Relative 8  3 - 12 (%)   Monocytes Absolute 0.5  0.1 - 1.0 (K/uL)   Eosinophils Relative 2  0 - 5 (%)   Eosinophils Absolute 0.2  0.0 - 0.7 (K/uL)   Basophils Relative 0  0 - 1 (%)    Basophils Absolute 0.0  0.0 - 0.1 (K/uL)  COMPREHENSIVE METABOLIC PANEL      Component Value Range   Sodium 136  135 - 145 (mEq/L)   Potassium 3.7  3.5 - 5.1 (mEq/L)   Chloride 104  96 - 112 (mEq/L)   CO2 24  19 - 32 (mEq/L)   Glucose, Bld 102 (*) 70 - 99 (mg/dL)   BUN 8  6 - 23 (mg/dL)   Creatinine, Ser 7.82  0.50 - 1.10 (mg/dL)   Calcium 9.2  8.4 - 16.1 (mg/dL)   Total Protein 8.1  6.0 - 8.3 (g/dL)   Albumin 3.3 (*) 3.5 - 5.2 (g/dL)   AST 14  0 - 37 (U/L)   ALT 22  0 - 35 (U/L)   Alkaline Phosphatase 97  39 - 117 (U/L)   Total Bilirubin 0.3  0.3 - 1.2 (mg/dL)   GFR calc non Af Amer >90  >90 (mL/min)   GFR calc Af Amer >90  >90 (mL/min)  POCT PREGNANCY, URINE      Component Value Range   Preg Test, Ur NEGATIVE    URINE MICROSCOPIC-ADD ON      Component Value Range   Squamous Epithelial / LPF RARE  RARE    WBC, UA 3-6  <3 (WBC/hpf)   RBC / HPF 0-2  <3 (RBC/hpf)   Bacteria, UA RARE  RARE    Ct Abdomen Pelvis W Contrast 07/02/2011  *RADIOLOGY REPORT*  Clinical Data: Persistent epigastric pain  CT ABDOMEN AND PELVIS WITH CONTRAST  Technique:  Multidetector CT imaging of the abdomen and pelvis was performed following the standard protocol during bolus administration of intravenous contrast.  Contrast: OMNIPAQUE IOHEXOL 300 MG/ML IV SOLN  Comparison: CT abdomen 01/13/2010  Findings: Lung bases are clear.  No pericardial fluid.  No focal hepatic lesion.  The gallbladder, pancreas, spleen, and adrenal glands are normal.  There are bilateral nephrolithiasis.  The stones measure approximately 3 mm each and number approximately 6 to 8 stones within each kidney.  No evidence of ureterolithiasis. Calcifications  adjacent to the distal ureters are unchanged from prior felt to be external to the ureters.  The number of calculi in the kidneys may be slightly increased.  The stomach, small bowel, appendix, and colon are normal.  Abdominal aorta is normal caliber.  No retroperitoneal or periportal  lymphadenopathy.  No free fluid the pelvis.  The bladder is normal.  Bladder stones. No pelvic lymphadenopathy. Uterus is normal.  No evidence of axillary, supraclavicular, mediastinal, or hilar lymphadenopathy.  IMPRESSION:  1.  No acute abdominal or pelvic findings. 2.  Normal appendix. 3.  Bilateral nephrolithiasis which is increased from prior.  No ureterolithiasis or obstructive uropathy.  Original Report Authenticated By: Genevive Bi, M.D.   Dg Abd Acute W/chest 07/11/2011  *RADIOLOGY REPORT*  Clinical Data: Rule out small bowel obstruction or free air. Abdominal pain.  ACUTE ABDOMEN SERIES (ABDOMEN 2 VIEW & CHEST 1 VIEW)  Comparison: CT of the abdomen and pelvis 07/02/2011  Findings: Cardiomediastinal silhouette is within normal limits. The lungs are free of focal consolidations and pleural effusions. No pulmonary edema.  No free intraperitoneal air beneath the diaphragm.  Supine and erect views are performed of the abdomen, showing a nonobstructive bowel gas pattern. Visualized osseous structures have a normal appearance.  No evidence for organomegaly. No abnormal calcifications.  IMPRESSION:  1. No evidence for acute cardiopulmonary abnormality. 2.  Nonobstructive bowel gas pattern.  Original Report Authenticated By: Patterson Hammersmith, M.D.    12:44 PM:  Has tol PO well while in ED.  No vomiting or diarrhea while in ED.  Will hold another imaging study of abd today given normal CT A/P 9 days ago, afebrile, labs normal.  Wants to go home now.  Dx testing d/w pt and family.  Questions answered.  Verb understanding, agreeable to d/c home with outpt f/u.       I personally performed the services described in  this documentation, which was scribed in my presence. The recorded information has been reviewed and considered.  Lavonta Tillis Allison Quarry, DO 07/12/11 2020

## 2013-04-27 ENCOUNTER — Ambulatory Visit: Payer: Self-pay | Admitting: Physician Assistant

## 2013-04-28 ENCOUNTER — Ambulatory Visit: Payer: Self-pay | Admitting: Physician Assistant

## 2016-03-09 ENCOUNTER — Emergency Department (HOSPITAL_BASED_OUTPATIENT_CLINIC_OR_DEPARTMENT_OTHER)
Admission: EM | Admit: 2016-03-09 | Discharge: 2016-03-09 | Disposition: A | Discharge status: Home / Self Care | Payer: Medicaid Other | Attending: Emergency Medicine | Admitting: Emergency Medicine

## 2016-03-09 ENCOUNTER — Emergency Department (HOSPITAL_BASED_OUTPATIENT_CLINIC_OR_DEPARTMENT_OTHER): Payer: Medicaid Other

## 2016-03-09 ENCOUNTER — Encounter (HOSPITAL_BASED_OUTPATIENT_CLINIC_OR_DEPARTMENT_OTHER): Payer: Self-pay | Admitting: *Deleted

## 2016-03-09 DIAGNOSIS — R300 Dysuria: Secondary | ICD-10-CM | POA: Insufficient documentation

## 2016-03-09 DIAGNOSIS — R3915 Urgency of urination: Secondary | ICD-10-CM | POA: Insufficient documentation

## 2016-03-09 DIAGNOSIS — R6510 Systemic inflammatory response syndrome (SIRS) of non-infectious origin without acute organ dysfunction: Secondary | ICD-10-CM | POA: Insufficient documentation

## 2016-03-09 DIAGNOSIS — J039 Acute tonsillitis, unspecified: Secondary | ICD-10-CM

## 2016-03-09 DIAGNOSIS — J45909 Unspecified asthma, uncomplicated: Secondary | ICD-10-CM | POA: Insufficient documentation

## 2016-03-09 DIAGNOSIS — Z79899 Other long term (current) drug therapy: Secondary | ICD-10-CM | POA: Insufficient documentation

## 2016-03-09 DIAGNOSIS — M549 Dorsalgia, unspecified: Secondary | ICD-10-CM | POA: Insufficient documentation

## 2016-03-09 DIAGNOSIS — R109 Unspecified abdominal pain: Secondary | ICD-10-CM | POA: Insufficient documentation

## 2016-03-09 LAB — URINALYSIS, ROUTINE W REFLEX MICROSCOPIC
Bilirubin Urine: NEGATIVE
Glucose, UA: NEGATIVE mg/dL
Ketones, ur: NEGATIVE mg/dL
LEUKOCYTES UA: NEGATIVE
Nitrite: NEGATIVE
PROTEIN: NEGATIVE mg/dL
Specific Gravity, Urine: 1.013 (ref 1.005–1.030)
pH: 6.5 (ref 5.0–8.0)

## 2016-03-09 LAB — COMPREHENSIVE METABOLIC PANEL
ALT: 18 U/L (ref 14–54)
ANION GAP: 9 (ref 5–15)
AST: 19 U/L (ref 15–41)
Albumin: 3.8 g/dL (ref 3.5–5.0)
Alkaline Phosphatase: 89 U/L (ref 38–126)
BUN: 7 mg/dL (ref 6–20)
CHLORIDE: 104 mmol/L (ref 101–111)
CO2: 23 mmol/L (ref 22–32)
Calcium: 9 mg/dL (ref 8.9–10.3)
Creatinine, Ser: 0.77 mg/dL (ref 0.44–1.00)
Glucose, Bld: 114 mg/dL — ABNORMAL HIGH (ref 65–99)
POTASSIUM: 3.3 mmol/L — AB (ref 3.5–5.1)
SODIUM: 136 mmol/L (ref 135–145)
Total Bilirubin: 0.5 mg/dL (ref 0.3–1.2)
Total Protein: 7.9 g/dL (ref 6.5–8.1)

## 2016-03-09 LAB — CBC WITH DIFFERENTIAL/PLATELET
Basophils Absolute: 0 10*3/uL (ref 0.0–0.1)
Basophils Relative: 0 %
EOS ABS: 0 10*3/uL (ref 0.0–0.7)
Eosinophils Relative: 0 %
HEMATOCRIT: 40 % (ref 36.0–46.0)
HEMOGLOBIN: 14 g/dL (ref 12.0–15.0)
LYMPHS ABS: 1.9 10*3/uL (ref 0.7–4.0)
LYMPHS PCT: 12 %
MCH: 29.9 pg (ref 26.0–34.0)
MCHC: 35 g/dL (ref 30.0–36.0)
MCV: 85.5 fL (ref 78.0–100.0)
MONOS PCT: 9 %
Monocytes Absolute: 1.3 10*3/uL — ABNORMAL HIGH (ref 0.1–1.0)
NEUTROS ABS: 12 10*3/uL — AB (ref 1.7–7.7)
NEUTROS PCT: 79 %
Platelets: 263 10*3/uL (ref 150–400)
RBC: 4.68 MIL/uL (ref 3.87–5.11)
RDW: 13.6 % (ref 11.5–15.5)
WBC: 15.2 10*3/uL — AB (ref 4.0–10.5)

## 2016-03-09 LAB — I-STAT CG4 LACTIC ACID, ED
LACTIC ACID, VENOUS: 2.76 mmol/L — AB (ref 0.5–1.9)
LACTIC ACID, VENOUS: 1.33 mmol/L (ref 0.5–1.9)

## 2016-03-09 LAB — URINE MICROSCOPIC-ADD ON

## 2016-03-09 LAB — RAPID STREP SCREEN (MED CTR MEBANE ONLY): STREPTOCOCCUS, GROUP A SCREEN (DIRECT): NEGATIVE

## 2016-03-09 LAB — PREGNANCY, URINE: PREG TEST UR: NEGATIVE

## 2016-03-09 MED ORDER — AZTREONAM 1 G IJ SOLR
INTRAMUSCULAR | Status: AC
  Filled 2016-03-09: qty 2

## 2016-03-09 MED ORDER — SODIUM CHLORIDE 0.9 % IV BOLUS (SEPSIS)
2000.0000 mL | Freq: Once | INTRAVENOUS | Status: AC
  Administered 2016-03-09: 2000 mL via INTRAVENOUS

## 2016-03-09 MED ORDER — CLINDAMYCIN HCL 300 MG PO CAPS: 300.0000 mg | ORAL_CAPSULE | Freq: Three times a day (TID) | ORAL | 0 refills | Status: DC

## 2016-03-09 MED ORDER — DEXAMETHASONE SODIUM PHOSPHATE 10 MG/ML IJ SOLN
10.0000 mg | Freq: Four times a day (QID) | INTRAMUSCULAR | Status: DC
  Administered 2016-03-09: 10 mg via INTRAVENOUS
  Filled 2016-03-09: qty 1

## 2016-03-09 MED ORDER — ACETAMINOPHEN 500 MG PO TABS
1000.0000 mg | ORAL_TABLET | Freq: Once | ORAL | Status: AC
Start: 2016-03-09 — End: 2016-03-09
  Administered 2016-03-09: 1000 mg via ORAL
  Filled 2016-03-09: qty 2

## 2016-03-09 MED ORDER — VANCOMYCIN HCL 10 G IV SOLR
2500.0000 mg | Freq: Once | INTRAVENOUS | Status: AC
  Administered 2016-03-09: 2500 mg via INTRAVENOUS
  Filled 2016-03-09: qty 2500

## 2016-03-09 MED ORDER — VANCOMYCIN HCL 500 MG IV SOLR
INTRAVENOUS | Status: AC
  Filled 2016-03-09: qty 2500

## 2016-03-09 MED ORDER — VANCOMYCIN HCL IN DEXTROSE 1-5 GM/200ML-% IV SOLN
1000.0000 mg | Freq: Once | INTRAVENOUS | Status: DC
Start: 2016-03-09 — End: 2016-03-09

## 2016-03-09 MED ORDER — VANCOMYCIN HCL 10 G IV SOLR
1250.0000 mg | Freq: Three times a day (TID) | INTRAVENOUS | Status: DC
  Filled 2016-03-09: qty 1250

## 2016-03-09 MED ORDER — KETOROLAC TROMETHAMINE 30 MG/ML IJ SOLN
30.0000 mg | Freq: Once | INTRAMUSCULAR | Status: AC
  Administered 2016-03-09: 30 mg via INTRAVENOUS
  Filled 2016-03-09: qty 1

## 2016-03-09 MED ORDER — LEVOFLOXACIN IN D5W 750 MG/150ML IV SOLN: 750.0000 mg | INTRAVENOUS | Status: DC

## 2016-03-09 MED ORDER — DEXTROSE 5 % IV SOLN
2.0000 g | Freq: Three times a day (TID) | INTRAVENOUS | Status: DC
  Filled 2016-03-09: qty 2

## 2016-03-09 MED ORDER — DEXTROSE 5 % IV SOLN
2.0000 g | Freq: Once | INTRAVENOUS | Status: AC
  Administered 2016-03-09: 2 g via INTRAVENOUS
  Filled 2016-03-09: qty 2

## 2016-03-09 MED ORDER — SODIUM CHLORIDE 0.9 % IV BOLUS (SEPSIS)
1600.0000 mL | Freq: Once | INTRAVENOUS | Status: AC
  Administered 2016-03-09: 1600 mL via INTRAVENOUS

## 2016-03-09 MED ORDER — LEVOFLOXACIN IN D5W 750 MG/150ML IV SOLN
750.0000 mg | Freq: Once | INTRAVENOUS | Status: AC
  Administered 2016-03-09: 750 mg via INTRAVENOUS
  Filled 2016-03-09: qty 150

## 2016-03-09 NOTE — ED Provider Notes (Signed)
[facility] Note   CSN: [id number] Arrival date & time: 03/09/16  1753  By signing my name below, I, Aggie MoatsJenny Song, attest that this documentation has been prepared under the direction and in the presence of Loren Raceravid Jasyah Theurer, MD. Electronically Signed: Aggie MoatsJenny Song, ED Scribe. 03/09/16. 10:36 PM.    History   Chief Complaint Chief Complaint  Patient presents with  . Fever     The history is provided by the patient. No language interpreter was used.   HPI Comments:  Kerri Fisher is a 35 y.o. female who presents to the Emergency Department complaining of fever, which started today. Pt reports that she initially felt sore throat yesterday, which prompted her to take Sudafed and Ibuprofen. Pt had a temperature of 99 this morning and current temp is 103.2. Associated symptoms per pt include sore throat, body aches, chills, throbbing headache, left earache, SOB, pain with deep breaths, urinary frequency and abdominal pain, which started 1-2 weeks ago. Denies rhinorrhea, sinus congestion, cough, chest pain, pain in lower back, dysuria. No known exposures to illnesses.      Past Medical History:  Diagnosis Date  . Asthma   . Migraine     There are no active problems to display for this patient.   History reviewed. No pertinent surgical history.  OB History    No data available       Home Medications    Prior to Admission medications   Medication Sig Start Date End Date Taking? Authorizing Provider  albuterol (PROVENTIL HFA;VENTOLIN HFA) 108 (90 BASE) MCG/ACT inhaler Inhale 1 puff into the lungs every 6 (six) hours as needed. For asthma   Yes Historical Provider, MD  Cyanocobalamin (B-12) 2000 MCG TABS Take 1 tablet by mouth every morning.     Yes Historical Provider, MD  Multiple Vitamins-Minerals (MULTIVITAMINS THER. W/MINERALS) TABS Take 1 tablet by mouth daily.    Yes Historical Provider, MD  clindamycin (CLEOCIN) 300 MG capsule Take 1 capsule (300 mg  total) by mouth 3 (three) times daily. X 7 days 03/09/16   Loren Raceravid Trystian Crisanto, MD  cyclobenzaprine (FLEXERIL) 10 MG tablet Take 10 mg by mouth daily as needed.     Historical Provider, MD  HYDROcodone-acetaminophen (NORCO) 5-325 MG per tablet Take 1-2 tablets by mouth every 4 (four) hours as needed. For pain    Historical Provider, MD  medroxyPROGESTERone (DEPO-PROVERA) 150 MG/ML injection Inject 150 mg into the muscle every 3 (three) months.      Historical Provider, MD    Family History No family history on file.  Social History Social History  Substance Use Topics  . Smoking status: Never Smoker  . Smokeless tobacco: Never Used  . Alcohol use No     Allergies   Tomato; Peanut-containing drug products; and Penicillins   Review of Systems Review of Systems  Constitutional: Positive for chills, fatigue and fever.  HENT: Positive for sore throat. Negative for congestion and sinus pressure.   Respiratory: Positive for shortness of breath. Negative for cough and chest tightness.   Cardiovascular: Negative for chest pain, palpitations and leg swelling.  Gastrointestinal: Positive for abdominal pain. Negative for diarrhea, nausea and vomiting.  Genitourinary: Positive for difficulty urinating, dysuria and urgency. Negative for flank pain, frequency and pelvic pain.  Musculoskeletal: Positive for back pain, myalgias and neck pain. Negative for neck stiffness.  Skin: Negative for rash and wound.  Neurological: Positive for headaches. Negative for dizziness, syncope, weakness, light-headedness and numbness.  All other systems  reviewed and are negative.    Physical Exam Updated Vital Signs BP 121/86 (BP Location: Right Arm)   Pulse 107   Temp 98.9 F (37.2 C)   Resp 12   Ht 5\' 6"  (1.676 m)   Wt 270 lb (122.5 kg)   SpO2 98%   BMI 43.58 kg/m   Physical Exam  Constitutional: She is oriented to person, place, and time. She appears well-developed and well-nourished.  HENT:  Head:  Normocephalic and atraumatic.  Mouth/Throat: Oropharyngeal exudate (bilateral tonsillar hypertrophywith erythema and exudates.Marland Kitchen Uvula is midline.) present.  Eyes: EOM are normal. Pupils are equal, round, and reactive to light.  Neck: Normal range of motion. Neck supple.  No meningismus  Cardiovascular: Regular rhythm.   Tachycardia  Pulmonary/Chest: Effort normal. She has rales.  Few scattered Rales especially in the right lung field  Abdominal: Soft. Bowel sounds are normal. She exhibits no distension. There is tenderness (diffuse abdominal tenderness without focality. No rebound or guarding.). There is no rebound and no guarding.  Musculoskeletal: Normal range of motion. She exhibits no edema or tenderness.  No CVA tenderness bilaterally.  Lymphadenopathy:    She has cervical adenopathy.  Neurological: She is alert and oriented to person, place, and time.  Results from this without deficit. Sensation is fully intact.  Skin: Skin is warm and dry. Capillary refill takes less than 2 seconds. No rash noted. No erythema.  Psychiatric: She has a normal mood and affect. Her behavior is normal.  Nursing note and vitals reviewed.    ED Treatments / Results  DIAGNOSTIC STUDIES:  Oxygen Saturation is 96% on room air, normal by my interpretation.    COORDINATION OF CARE:  7:10 PM Discussed treatment plan with pt at bedside, which includes chest x-ray to r/o pneumonia, and pt agreed to plan.  Labs (all labs ordered are listed, but only abnormal results are displayed) Labs Reviewed  URINALYSIS, ROUTINE W REFLEX MICROSCOPIC (NOT AT [facility]) - Abnormal; Notable for the following:       Result Value   Hgb urine dipstick TRACE (*)    All other components within normal limits  COMPREHENSIVE METABOLIC PANEL - Abnormal; Notable for the following:    Potassium 3.3 (*)    Glucose, Bld 114 (*)    All other components within normal limits  CBC WITH DIFFERENTIAL/PLATELET - Abnormal; Notable for the  following:    WBC 15.2 (*)    Neutro Abs 12.0 (*)    Monocytes Absolute 1.3 (*)    All other components within normal limits  URINE MICROSCOPIC-ADD ON - Abnormal; Notable for the following:    Squamous Epithelial / LPF 0-5 (*)    Bacteria, UA FEW (*)    All other components within normal limits  I-STAT CG4 LACTIC ACID, ED - Abnormal; Notable for the following:    Lactic Acid, Venous 2.76 (*)    All other components within normal limits  RAPID STREP SCREEN (NOT AT [facility])  CULTURE, BLOOD (ROUTINE X 2)  CULTURE, BLOOD (ROUTINE X 2)  CULTURE, GROUP A STREP ([facility])  URINE CULTURE  PREGNANCY, URINE  I-STAT CG4 LACTIC ACID, ED    EKG  EKG Interpretation None       Radiology Dg Chest 2 View  Result Date: 03/09/2016 CLINICAL DATA:  Fever and headaches ; chest pain and shortness of breath for 1 week EXAM: CHEST  2 VIEW COMPARISON:  May 28, 2011 FINDINGS: There is no edema or consolidation. Heart size and pulmonary vascularity are  normal. No pneumothorax. No adenopathy. No bone lesions. IMPRESSION: No edema or consolidation. Electronically Signed   By: Bretta BangWilliam  Woodruff III M.D.   On: 03/09/2016 20:30    Procedures Procedures (including critical care time)  Medications Ordered in ED Medications  dexamethasone (DECADRON) injection 10 mg (10 mg Intravenous Given 03/09/16 1936)  vancomycin (VANCOCIN) 2,500 mg in sodium chloride 0.9 % 500 mL IVPB (2,500 mg Intravenous New Bag/Given 03/09/16 2155)  aztreonam (AZACTAM) 1 g injection (  Canceled Entry 03/09/16 2043)  levofloxacin (LEVAQUIN) IVPB 750 mg (not administered)  aztreonam (AZACTAM) 2 g in dextrose 5 % 50 mL IVPB (not administered)  vancomycin (VANCOCIN) 1,250 mg in sodium chloride 0.9 % 250 mL IVPB (not administered)  acetaminophen (TYLENOL) tablet 1,000 mg (1,000 mg Oral Given 03/09/16 1805)  sodium chloride 0.9 % bolus 2,000 mL (0 mLs Intravenous Stopped 03/09/16 2043)  ketorolac (TORADOL) 30 MG/ML injection 30 mg (30 mg  Intravenous Given 03/09/16 1936)  levofloxacin (LEVAQUIN) IVPB 750 mg (0 mg Intravenous Stopped 03/09/16 2210)  aztreonam (AZACTAM) 2 g in dextrose 5 % 50 mL IVPB (0 g Intravenous Stopped 03/09/16 2058)  sodium chloride 0.9 % bolus 1,600 mL (1,600 mLs Intravenous New Bag/Given 03/09/16 2043)     Initial Impression / Assessment and Plan / ED Course  I have reviewed the triage vital signs and the nursing notes.  Pertinent labs & imaging results that were available during my care of the patient were reviewed by me and considered in my medical decision making (see chart for details).  Clinical Course  Given elevation in lactic acid, tachycardia, fever and leukocytosis, initiated sepsis protocol. Given 30 mL per KG of IV fluids as well as broad-spectrum antibiotics. Tachycardia and fever have resolved. The lactic acid is now normal. Patient states she is feeling much better. Asking to be discharged home. She is eating without any difficulty. Believe the patient can be discharged home with antibiotics for tonsillitis. She understands the need to return immediately for any worsening of her symptoms or concerns.    Final Clinical Impressions(s) / ED Diagnoses   Final diagnoses:  Tonsillitis  SIRS (systemic inflammatory response syndrome) (HCC)    New Prescriptions New Prescriptions   CLINDAMYCIN (CLEOCIN) 300 MG CAPSULE    Take 1 capsule (300 mg total) by mouth 3 (three) times daily. X 7 days  I personally performed the services described in this documentation, which was scribed in my presence. The recorded information has been reviewed and is accurate.      Loren Raceravid Jahki Witham, MD 03/09/16 2236

## 2016-03-09 NOTE — Progress Notes (Signed)
Pharmacy Antibiotic Note  Kerri Fisher is a 35 y.o. female admitted on 03/09/2016 with sepsis.  Pharmacy has been consulted for aztreonam, levofloxacin, and vancomycin dosing. Patient has a PCN allergy with an unknown reaction. Tmax 103.2, WBC 15.2, Lactate 2.76, renal function ok  Plan: - Vancomycin 2500 mg IV loading dose x 1, followed by 1250 mg IV q8h  - VT at SS - Aztreonam 2 g IV once, then 2 g IV q8h - Levofloxacin 750 mg IV once, then 750 mg IV q24h - Monitor renal function, s/sx's of infection, and CBC as necessary  Height: 5\' 6"  (167.6 cm) Weight: 270 lb (122.5 kg) IBW/kg (Calculated) : 59.3  Temp (24hrs), Avg:102.2 F (39 C), Min:101.1 F (38.4 C), Max:103.2 F (39.6 C)   Recent Labs Lab 03/09/16 1910 03/09/16 1955  WBC 15.2*  --   LATICACIDVEN  --  2.76*    CrCl cannot be calculated (Patient's most recent lab result is older than the maximum 21 days allowed.).    Allergies  Allergen Reactions  . Tomato Anaphylaxis  . Peanut-Containing Drug Products Hives and Itching  . Penicillins Other (See Comments)    Unknown Childhood reaction    Antimicrobials this admission: 8/14 Aztreonam >>  8/14 Levofloxacin >> 8/14 Vancomycin >>   Microbiology results: 8/14 BCx:  8/14 Rapid strep screen: neg  Thank you for allowing pharmacy to be a part of this patient's care.  Allie BossierApryl Draiden Mirsky, PharmD PGY1 Pharmacy Resident [phone] (Pager) 03/09/2016 8:21 PM

## 2016-03-09 NOTE — ED Notes (Signed)
MD at bedside. 

## 2016-03-09 NOTE — ED Triage Notes (Signed)
Fever, sore throat, body aches, chills, and headache. Ibuprofen 7 hours ago.

## 2016-03-09 NOTE — ED Notes (Signed)
Patient transported to X-ray 

## 2016-03-09 NOTE — ED Notes (Signed)
Pt unable to provide a urine specimen at this time

## 2016-03-11 LAB — URINE CULTURE

## 2016-03-12 LAB — CULTURE, GROUP A STREP (THRC)

## 2016-03-14 LAB — CULTURE, BLOOD (ROUTINE X 2)
Culture: NO GROWTH
Culture: NO GROWTH

## 2017-04-27 ENCOUNTER — Emergency Department (HOSPITAL_BASED_OUTPATIENT_CLINIC_OR_DEPARTMENT_OTHER): Payer: No Typology Code available for payment source

## 2017-04-27 ENCOUNTER — Emergency Department (HOSPITAL_BASED_OUTPATIENT_CLINIC_OR_DEPARTMENT_OTHER)
Admission: EM | Admit: 2017-04-27 | Discharge: 2017-04-27 | Disposition: A | Discharge status: Home / Self Care | Payer: No Typology Code available for payment source | Attending: Emergency Medicine | Admitting: Emergency Medicine

## 2017-04-27 ENCOUNTER — Encounter (HOSPITAL_BASED_OUTPATIENT_CLINIC_OR_DEPARTMENT_OTHER): Payer: Self-pay

## 2017-04-27 DIAGNOSIS — M6281 Muscle weakness (generalized): Secondary | ICD-10-CM | POA: Insufficient documentation

## 2017-04-27 DIAGNOSIS — Y998 Other external cause status: Secondary | ICD-10-CM | POA: Insufficient documentation

## 2017-04-27 DIAGNOSIS — R202 Paresthesia of skin: Secondary | ICD-10-CM | POA: Diagnosis not present

## 2017-04-27 DIAGNOSIS — J45909 Unspecified asthma, uncomplicated: Secondary | ICD-10-CM | POA: Diagnosis not present

## 2017-04-27 DIAGNOSIS — S161XXA Strain of muscle, fascia and tendon at neck level, initial encounter: Secondary | ICD-10-CM

## 2017-04-27 DIAGNOSIS — R0789 Other chest pain: Secondary | ICD-10-CM | POA: Insufficient documentation

## 2017-04-27 DIAGNOSIS — Y92410 Unspecified street and highway as the place of occurrence of the external cause: Secondary | ICD-10-CM | POA: Diagnosis not present

## 2017-04-27 DIAGNOSIS — Y9389 Activity, other specified: Secondary | ICD-10-CM | POA: Diagnosis not present

## 2017-04-27 DIAGNOSIS — Z91010 Allergy to peanuts: Secondary | ICD-10-CM | POA: Diagnosis not present

## 2017-04-27 DIAGNOSIS — R079 Chest pain, unspecified: Secondary | ICD-10-CM | POA: Insufficient documentation

## 2017-04-27 DIAGNOSIS — S199XXA Unspecified injury of neck, initial encounter: Secondary | ICD-10-CM | POA: Diagnosis present

## 2017-04-27 DIAGNOSIS — Z79899 Other long term (current) drug therapy: Secondary | ICD-10-CM | POA: Diagnosis not present

## 2017-04-27 DIAGNOSIS — M25512 Pain in left shoulder: Secondary | ICD-10-CM | POA: Insufficient documentation

## 2017-04-27 MED ORDER — METHOCARBAMOL 500 MG PO TABS
750.0000 mg | ORAL_TABLET | Freq: Once | ORAL | Status: AC
  Administered 2017-04-27: 750 mg via ORAL
  Filled 2017-04-27: qty 2

## 2017-04-27 MED ORDER — NAPROXEN 500 MG PO TABS: 500.0000 mg | ORAL_TABLET | Freq: Two times a day (BID) | ORAL | 0 refills | Status: DC

## 2017-04-27 MED ORDER — CYCLOBENZAPRINE HCL 10 MG PO TABS: 10.0000 mg | ORAL_TABLET | Freq: Two times a day (BID) | ORAL | 0 refills | Status: AC | PRN

## 2017-04-27 MED ORDER — TRAMADOL HCL 50 MG PO TABS: 50.0000 mg | ORAL_TABLET | Freq: Four times a day (QID) | ORAL | 0 refills | Status: DC | PRN

## 2017-04-27 MED ORDER — KETOROLAC TROMETHAMINE 60 MG/2ML IM SOLN
30.0000 mg | Freq: Once | INTRAMUSCULAR | Status: AC
  Administered 2017-04-27: 30 mg via INTRAMUSCULAR
  Filled 2017-04-27: qty 2
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
## 2017-04-27 NOTE — Discharge Instructions (Signed)
Take naproxen for pain and inflammation as prescribed. Tramadol for severe pain. Flexeril for spasms. Try heating pads, stretches. Follow-up with family doctor not improving in 3-5 days. Return if worsening symptoms.
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
## 2019-04-12 ENCOUNTER — Other Ambulatory Visit: Payer: Self-pay

## 2019-04-12 ENCOUNTER — Encounter (HOSPITAL_BASED_OUTPATIENT_CLINIC_OR_DEPARTMENT_OTHER): Payer: Self-pay | Admitting: Emergency Medicine

## 2019-04-12 DIAGNOSIS — Z5321 Procedure and treatment not carried out due to patient leaving prior to being seen by health care provider: Secondary | ICD-10-CM | POA: Insufficient documentation

## 2019-04-12 DIAGNOSIS — R51 Headache: Secondary | ICD-10-CM | POA: Insufficient documentation

## 2019-04-12 NOTE — ED Triage Notes (Signed)
Pt is c/o migraine headache that started 4 days ago  Pt states she went to work today and had nausea and vomiting  States she took advil when she got home today around 1230, laid in a dark room, and put an ice pack on her head without relief  Has hx of migraines

## 2019-04-13 ENCOUNTER — Emergency Department (HOSPITAL_BASED_OUTPATIENT_CLINIC_OR_DEPARTMENT_OTHER)
Admission: EM | Admit: 2019-04-13 | Discharge: 2019-04-13 | Disposition: A | Discharge status: Home / Self Care | Payer: Self-pay | Attending: Emergency Medicine | Admitting: Emergency Medicine

## 2021-07-27 HISTORY — PX: BRAIN SURGERY: SHX531

## 2022-10-17 ENCOUNTER — Emergency Department (HOSPITAL_BASED_OUTPATIENT_CLINIC_OR_DEPARTMENT_OTHER): Payer: BLUE CROSS/BLUE SHIELD

## 2022-10-17 ENCOUNTER — Emergency Department (HOSPITAL_BASED_OUTPATIENT_CLINIC_OR_DEPARTMENT_OTHER)
Admission: EM | Admit: 2022-10-17 | Discharge: 2022-10-17 | Disposition: A | Discharge status: Home / Self Care | Payer: BLUE CROSS/BLUE SHIELD | Attending: Emergency Medicine | Admitting: Emergency Medicine

## 2022-10-17 ENCOUNTER — Encounter (HOSPITAL_BASED_OUTPATIENT_CLINIC_OR_DEPARTMENT_OTHER): Payer: Self-pay

## 2022-10-17 ENCOUNTER — Other Ambulatory Visit: Payer: Self-pay

## 2022-10-17 DIAGNOSIS — Z91010 Allergy to peanuts: Secondary | ICD-10-CM | POA: Diagnosis not present

## 2022-10-17 DIAGNOSIS — N23 Unspecified renal colic: Secondary | ICD-10-CM | POA: Diagnosis not present

## 2022-10-17 DIAGNOSIS — J45909 Unspecified asthma, uncomplicated: Secondary | ICD-10-CM | POA: Insufficient documentation

## 2022-10-17 DIAGNOSIS — D72829 Elevated white blood cell count, unspecified: Secondary | ICD-10-CM | POA: Diagnosis not present

## 2022-10-17 DIAGNOSIS — N132 Hydronephrosis with renal and ureteral calculous obstruction: Secondary | ICD-10-CM | POA: Diagnosis not present

## 2022-10-17 DIAGNOSIS — R739 Hyperglycemia, unspecified: Secondary | ICD-10-CM | POA: Insufficient documentation

## 2022-10-17 DIAGNOSIS — R109 Unspecified abdominal pain: Secondary | ICD-10-CM | POA: Diagnosis present

## 2022-10-17 DIAGNOSIS — R7309 Other abnormal glucose: Secondary | ICD-10-CM

## 2022-10-17 DIAGNOSIS — N201 Calculus of ureter: Secondary | ICD-10-CM

## 2022-10-17 LAB — CBC
HCT: 41.5 % (ref 36.0–46.0)
Hemoglobin: 14 g/dL (ref 12.0–15.0)
MCH: 29 pg (ref 26.0–34.0)
MCHC: 33.7 g/dL (ref 30.0–36.0)
MCV: 85.9 fL (ref 80.0–100.0)
Platelets: 323 10*3/uL (ref 150–400)
RBC: 4.83 MIL/uL (ref 3.87–5.11)
RDW: 13.5 % (ref 11.5–15.5)
WBC: 11 10*3/uL — ABNORMAL HIGH (ref 4.0–10.5)
nRBC: 0 % (ref 0.0–0.2)

## 2022-10-17 LAB — BASIC METABOLIC PANEL
Anion gap: 7 (ref 5–15)
BUN: 9 mg/dL (ref 6–20)
CO2: 25 mmol/L (ref 22–32)
Calcium: 8.9 mg/dL (ref 8.9–10.3)
Chloride: 103 mmol/L (ref 98–111)
Creatinine, Ser: 0.82 mg/dL (ref 0.44–1.00)
GFR, Estimated: >60 mL/min (ref >60)
Glucose, Bld: 143 mg/dL — ABNORMAL HIGH (ref 70–99)
Potassium: 3.6 mmol/L (ref 3.5–5.1)
Sodium: 135 mmol/L (ref 135–145)

## 2022-10-17 LAB — URINALYSIS, ROUTINE W REFLEX MICROSCOPIC
Bilirubin Urine: NEGATIVE
Glucose, UA: NEGATIVE mg/dL
Ketones, ur: NEGATIVE mg/dL
Leukocytes,Ua: NEGATIVE
Nitrite: NEGATIVE
Protein, ur: 30 mg/dL — AB
Specific Gravity, Urine: 1.02 (ref 1.005–1.030)
pH: 7 (ref 5.0–8.0)

## 2022-10-17 LAB — PREGNANCY, URINE: Preg Test, Ur: NEGATIVE

## 2022-10-17 LAB — URINALYSIS, MICROSCOPIC (REFLEX)

## 2022-10-17 MED ORDER — KETOROLAC TROMETHAMINE 30 MG/ML IJ SOLN
30.0000 mg | Freq: Once | INTRAMUSCULAR | Status: AC
  Administered 2022-10-17: 30 mg via INTRAVENOUS
  Filled 2022-10-17: qty 1

## 2022-10-17 MED ORDER — HYDROCODONE-ACETAMINOPHEN 5-325 MG PO TABS: 1.0000 | ORAL_TABLET | ORAL | 0 refills | Status: AC | PRN

## 2022-10-17 MED ORDER — ONDANSETRON HCL 4 MG/2ML IJ SOLN
4.0000 mg | Freq: Once | INTRAMUSCULAR | Status: AC
  Administered 2022-10-17: 4 mg via INTRAVENOUS
  Filled 2022-10-17: qty 2

## 2022-10-17 MED ORDER — TAMSULOSIN HCL 0.4 MG PO CAPS: 0.4000 mg | ORAL_CAPSULE | Freq: Every day | ORAL | 0 refills | Status: AC

## 2022-10-17 MED ORDER — MORPHINE SULFATE (PF) 4 MG/ML IV SOLN
4.0000 mg | Freq: Once | INTRAVENOUS | Status: AC
  Administered 2022-10-17: 4 mg via INTRAVENOUS
  Filled 2022-10-17: qty 1

## 2022-10-17 NOTE — ED Triage Notes (Signed)
Pt reports left flank pain beginning 1.5 weeks ago. Went to PCP was told she has blood in urine and then pain subsided. Pt states pain started coming back 3 days ago, began drinking more water. Pain became more severe tonight. History of kidney stones with similar presentation. Pt endorses N/V and not being able to keep anything down since 8:30pm. Denies fever. Pt states last time she urinated was around 7:30pm.

## 2022-10-17 NOTE — Discharge Instructions (Signed)
Return to the if you develop a fever or if pain is not being adequately controlled at home.  If you do need to return to the emergency department, please consider going to [facility] which is where all of our urology work is done.

## 2022-10-17 NOTE — ED Provider Notes (Signed)
[place] [facility] [facility] Provider Note   CSN: [id number] Arrival date & time: 10/17/22  0108     History  Chief Complaint  Patient presents with   Flank Pain    Kerri Fisher is a 42 y.o. female.  The history is provided by the patient.  Flank Pain  She has history of asthma, migraines and complains of left flank pain which started about 10 days ago, subsided and then recurred about 3 days ago.  Pain is nonradiating.  There is associated nausea and vomiting.  She denies fever, chills, sweats.  She has had some intermittent dysuria.  She has history of kidney stones and states that this feels similar.  She tried taking some ibuprofen, but vomited after taking it.   Home Medications Prior to Admission medications   Medication Sig Start Date End Date Taking? Authorizing Provider  albuterol (PROVENTIL HFA;VENTOLIN HFA) 108 (90 BASE) MCG/ACT inhaler Inhale 1 puff into the lungs every 6 (six) hours as needed. For asthma    [provider]  clindamycin (CLEOCIN) 300 MG capsule Take 1 capsule (300 mg total) by mouth 3 (three) times daily. X 7 days 03/09/16   Julianne Rice, MD  Cyanocobalamin (B-12) 2000 MCG TABS Take 1 tablet by mouth every morning.      [provider]  cyclobenzaprine (FLEXERIL) 10 MG tablet Take 1 tablet (10 mg total) by mouth 2 (two) times daily as needed for muscle spasms. 04/27/17   Kirichenko, Lahoma Rocker, PA-C  HYDROcodone-acetaminophen (NORCO) 5-325 MG per tablet Take 1-2 tablets by mouth every 4 (four) hours as needed. For pain    [provider]  medroxyPROGESTERone (DEPO-PROVERA) 150 MG/ML injection Inject 150 mg into the muscle every 3 (three) months.      [provider]  Multiple Vitamins-Minerals (MULTIVITAMINS THER. W/MINERALS) TABS Take 1 tablet by mouth daily.     [provider]  naproxen (NAPROSYN) 500 MG tablet Take 1 tablet (500 mg total) by mouth 2 (two) times daily. 04/27/17    Kirichenko, Tatyana, PA-C  traMADol (ULTRAM) 50 MG tablet Take 1 tablet (50 mg total) by mouth every 6 (six) hours as needed. 04/27/17   Kirichenko, Lahoma Rocker, PA-C      Allergies    Bee venom, Tomato, Peanut-containing drug products, and Penicillins    Review of Systems   Review of Systems  Genitourinary:  Positive for flank pain.  All other systems reviewed and are negative.   Physical Exam Updated Vital Signs BP 128/87 (BP Location: Left Arm)   Pulse (!) 106   Temp 98.5 F (36.9 C) (Oral)   Resp 18   Ht 5\' 6"  (1.676 m)   Wt 124.3 kg   SpO2 97%   BMI 44.22 kg/m  Physical Exam Vitals and nursing note reviewed.   42 year old female, appears uncomfortable, but is in no acute distress. Vital signs are significant for borderline elevated heart rate. Oxygen saturation is 97%, which is normal. Head is normocephalic and atraumatic. PERRLA, EOMI. Oropharynx is clear. Neck is nontender and supple without adenopathy or JVD. Back is nontender in the midline.  There is mild left CVA tenderness. Lungs are clear without rales, wheezes, or rhonchi. Chest is nontender. Heart has regular rate and rhythm without murmur. Abdomen is soft, flat, with mild tenderness in the left mid abdomen.  There is no rebound or guarding. Extremities have no cyanosis or edema, full range of motion is present. Skin is warm and dry  without rash. Neurologic: Mental status is normal, cranial nerves are intact, moves all extremities equally.  ED Results / Procedures / Treatments   Labs (all labs ordered are listed, but only abnormal results are displayed) Labs Reviewed  BASIC METABOLIC PANEL - Abnormal; Notable for the following components:      Result Value   Glucose, Bld 143 (*)    All other components within normal limits  CBC - Abnormal; Notable for the following components:   WBC 11.0 (*)    All other components within normal limits  URINALYSIS, ROUTINE W REFLEX MICROSCOPIC  PREGNANCY, URINE    Radiology CT Renal Stone Study  Result Date: 10/17/2022 CLINICAL DATA:  42 year old female with left flank pain for 1.5 weeks, microscopic hematuria. Nephrolithiasis and left ureterovesical junction calculus on CT last month. Subsequent encounter. EXAM: CT ABDOMEN AND PELVIS WITHOUT CONTRAST TECHNIQUE: Multidetector CT imaging of the abdomen and pelvis was performed following the standard protocol without IV contrast. RADIATION DOSE REDUCTION: This exam was performed according to the departmental dose-optimization program which includes automated exposure control, adjustment of the mA and/or kV according to patient size and/or use of iterative reconstruction technique. COMPARISON:  CT Abdomen and Pelvis 09/16/2022. FINDINGS: Lower chest: Mildly lower lung volumes, minimal atelectasis. No pericardial or pleural effusion. Hepatobiliary: Negative noncontrast liver and gallbladder. Pancreas: Negative. Spleen: Negative. Adrenals/Urinary Tract: Normal adrenal glands. Extensive bilateral nephrolithiasis, appearance raises the possibility of medullary nephrocalcinosis. Nonobstructed right kidney and right ureter. Mild to moderate left hydronephrosis has progressed along with nephromegaly, pararenal and periureteral inflammatory stranding (coronal image 66). Left hydroureter and surrounding inflammation continue into the pelvis. On series 2, image 78 there is an irregular 5-6 mm mm calculus located within 1 cm of the uro vesicle junction, and there are 2 additional small stones now within the urinary bladder just inside the left UVJ (series 2, image 81). The larger is 3 mm. Stomach/Bowel: Negative. Normal appendix on coronal image 72. No dilated bowel, free air or free fluid. Vascular/Lymphatic: Normal caliber abdominal aorta. Vascular patency is not evaluated in the absence of IV contrast. No calcified atherosclerosis or lymphadenopathy identified. Reproductive: IUD with no adverse features. Other: No pelvis free  fluid.  Right side pelvic phleboliths. Musculoskeletal: No acute osseous abnormality identified. IMPRESSION: 1. Moderate Acute obstructive uropathy on the Left now, with a 5-6 mm distal ureteral stone just upstream of the left UVJ, and two additional stones within the bladder near that UVJ. Underlying advanced bilateral nephrolithiasis. Medullary nephrocalcinosis not excluded. 2. No other acute or inflammatory process identified in the noncontrast abdomen or pelvis. Electronically Signed   By: Genevie Ann M.D.   On: 10/17/2022 04:43    Procedures Procedures    Medications Ordered in ED Medications - No data to display  ED Course/ Medical Decision Making/ A&P                             Medical Decision Making Amount and/or Complexity of Data Reviewed Labs: ordered. Radiology: ordered.  Risk Prescription drug management.   Left flank pain.  Differential diagnosis includes, but is not limited to, urolithiasis, pyelonephritis, diverticulitis, abdominal aortic aneurysm.  I have ordered intravenous ketorolac, morphine, ondansetron.  I have reviewed and interpreted her laboratory tests and my interpretation is elevated random glucose level which will need to be followed as an outpatient, mild leukocytosis which is nonspecific.  Urinalysis and pregnancy test are still pending.  I have ordered a  renal stone protocol CT scan.  I reviewed her past records, and on 07/02/2011 she had a CT of abdomen and pelvis which showed numerous bilateral renal calculi.  CT scan shows 6 mm distal left ureteral calculus with proximal hydronephrosis, 2 additional calculi which are either in the distal ureter or in the bladder.  She had good relief of pain with above-noted treatment.  I am discharging her with prescriptions for hydrocodone-acetaminophen and tamsulosin.  She is referred to urology for follow-up.  Return precautions discussed, including uncontrolled pain and development of fever.  Final Clinical Impression(s)  / ED Diagnoses Final diagnoses:  Renal colic on left side  Ureterolithiasis  Elevated random blood glucose level    Rx / DC Orders ED Discharge Orders          Ordered    HYDROcodone-acetaminophen (NORCO/VICODIN) 5-325 MG tablet  Every 4 hours PRN        10/17/22 0512    tamsulosin (FLOMAX) 0.4 MG CAPS capsule  Daily        10/17/22 [id number]              Delora Fuel, MD [id number]

## 2023-10-10 ENCOUNTER — Other Ambulatory Visit: Payer: Self-pay

## 2023-10-10 ENCOUNTER — Emergency Department (HOSPITAL_BASED_OUTPATIENT_CLINIC_OR_DEPARTMENT_OTHER)
Admission: EM | Admit: 2023-10-10 | Discharge: 2023-10-10 | Disposition: A | Discharge status: Home / Self Care | Payer: Self-pay | Attending: Emergency Medicine | Admitting: Emergency Medicine

## 2023-10-10 ENCOUNTER — Encounter (HOSPITAL_BASED_OUTPATIENT_CLINIC_OR_DEPARTMENT_OTHER): Payer: Self-pay | Admitting: *Deleted

## 2023-10-10 DIAGNOSIS — R112 Nausea with vomiting, unspecified: Secondary | ICD-10-CM | POA: Insufficient documentation

## 2023-10-10 DIAGNOSIS — R1013 Epigastric pain: Secondary | ICD-10-CM | POA: Diagnosis not present

## 2023-10-10 DIAGNOSIS — R079 Chest pain, unspecified: Secondary | ICD-10-CM | POA: Insufficient documentation

## 2023-10-10 DIAGNOSIS — R1010 Upper abdominal pain, unspecified: Secondary | ICD-10-CM

## 2023-10-10 DIAGNOSIS — Z91010 Allergy to peanuts: Secondary | ICD-10-CM | POA: Insufficient documentation

## 2023-10-10 DIAGNOSIS — R197 Diarrhea, unspecified: Secondary | ICD-10-CM | POA: Diagnosis not present

## 2023-10-10 LAB — CBC WITH DIFFERENTIAL/PLATELET
Abs Immature Granulocytes: 0.03 10*3/uL (ref 0.00–0.07)
Basophils Absolute: 0 10*3/uL (ref 0.0–0.1)
Basophils Relative: 0 %
Eosinophils Absolute: 0.2 10*3/uL (ref 0.0–0.5)
Eosinophils Relative: 2 %
HCT: 42.6 % (ref 36.0–46.0)
Hemoglobin: 14.5 g/dL (ref 12.0–15.0)
Immature Granulocytes: 0 %
Lymphocytes Relative: 29 %
Lymphs Abs: 2 10*3/uL (ref 0.7–4.0)
MCH: 29.5 pg (ref 26.0–34.0)
MCHC: 34 g/dL (ref 30.0–36.0)
MCV: 86.6 fL (ref 80.0–100.0)
Monocytes Absolute: 0.4 10*3/uL (ref 0.1–1.0)
Monocytes Relative: 6 %
Neutro Abs: 4.3 10*3/uL (ref 1.7–7.7)
Neutrophils Relative %: 63 %
Platelets: 243 10*3/uL (ref 150–400)
RBC: 4.92 MIL/uL (ref 3.87–5.11)
RDW: 13.7 % (ref 11.5–15.5)
WBC: 6.9 10*3/uL (ref 4.0–10.5)
nRBC: 0 % (ref 0.0–0.2)

## 2023-10-10 LAB — COMPREHENSIVE METABOLIC PANEL
ALT: 26 U/L (ref 0–44)
AST: 21 U/L (ref 15–41)
Albumin: 3.6 g/dL (ref 3.5–5.0)
Alkaline Phosphatase: 86 U/L (ref 38–126)
Anion gap: 5 (ref 5–15)
BUN: 11 mg/dL (ref 6–20)
CO2: 27 mmol/L (ref 22–32)
Calcium: 8.5 mg/dL — ABNORMAL LOW (ref 8.9–10.3)
Chloride: 108 mmol/L (ref 98–111)
Creatinine, Ser: 0.71 mg/dL (ref 0.44–1.00)
GFR, Estimated: >60 mL/min (ref >60)
Glucose, Bld: 154 mg/dL — ABNORMAL HIGH (ref 70–99)
Potassium: 3.7 mmol/L (ref 3.5–5.1)
Sodium: 140 mmol/L (ref 135–145)
Total Bilirubin: 0.6 mg/dL (ref 0.0–1.2)
Total Protein: 7.9 g/dL (ref 6.5–8.1)

## 2023-10-10 LAB — HCG, SERUM, QUALITATIVE: Preg, Serum: NEGATIVE

## 2023-10-10 LAB — LIPASE, BLOOD: Lipase: 24 U/L (ref 11–51)

## 2023-10-10 LAB — TROPONIN I (HIGH SENSITIVITY): Troponin I (High Sensitivity): <2 ng/L (ref <18)

## 2023-10-10 MED ORDER — SODIUM CHLORIDE 0.9 % IV BOLUS
1000.0000 mL | Freq: Once | INTRAVENOUS | Status: AC
  Administered 2023-10-10: 1000 mL via INTRAVENOUS

## 2023-10-10 MED ORDER — ONDANSETRON HCL 4 MG/2ML IJ SOLN
4.0000 mg | Freq: Once | INTRAMUSCULAR | Status: AC
  Administered 2023-10-10: 4 mg via INTRAVENOUS
  Filled 2023-10-10: qty 2

## 2023-10-10 MED ORDER — LOPERAMIDE HCL 2 MG PO CAPS: 2.0000 mg | ORAL_CAPSULE | Freq: Four times a day (QID) | ORAL | 0 refills | Status: AC | PRN

## 2023-10-10 MED ORDER — LOPERAMIDE HCL 2 MG PO CAPS
4.0000 mg | ORAL_CAPSULE | Freq: Once | ORAL | Status: AC
  Administered 2023-10-10: 4 mg via ORAL
  Filled 2023-10-10: qty 2

## 2023-10-10 MED ORDER — ONDANSETRON HCL 4 MG PO TABS
4.0000 mg | ORAL_TABLET | Freq: Four times a day (QID) | ORAL | 0 refills | Status: AC
Start: 2023-10-10 — End: ?

## 2023-10-10 MED ORDER — DICYCLOMINE HCL 20 MG PO TABS
20.0000 mg | ORAL_TABLET | Freq: Two times a day (BID) | ORAL | 0 refills | Status: AC
Start: 2023-10-10 — End: ?

## 2023-10-10 MED ORDER — MORPHINE SULFATE (PF) 4 MG/ML IV SOLN
4.0000 mg | Freq: Once | INTRAVENOUS | Status: AC
  Administered 2023-10-10: 4 mg via INTRAVENOUS
  Filled 2023-10-10: qty 1

## 2023-10-10 NOTE — ED Triage Notes (Addendum)
 Pt presents with chest pain/ abdominal pain that started Friday however resolved. Pt stst thought she had food poisoning. Sunday @ 2am Cp presented again and worsen . Also abdominal pain - multiple episodes of  diarrhea, N/V last emesis 30 minutes ago. Never had Chest pain/abd pain like this before. Took Pepto bismol for relief. Was unable to keep on stomach.

## 2023-10-10 NOTE — ED Provider Notes (Signed)
 [place] [facility] Provider Note   CSN: [id number] Arrival date & time: 10/10/23  3474     History  Chief Complaint  Patient presents with   Chest Pain    Kerri Fisher is a 43 y.o. female.  The history is provided by the patient.  Chest Pain She has history of asthma and comes in because of nausea, vomiting, abdominal pain, chest pain.  She started getting sick 2 days ago and she thought she had food poisoning.  She has vomited numerous times and continued to have numerous episodes of diarrhea.  She denies any sick contacts.  She also endorses heavy feeling in her chest.  She denies fever, chills, sweats.  She denies any blood in stool or emesis.   Home Medications Prior to Admission medications   Medication Sig Start Date End Date Taking? Authorizing Provider  albuterol (PROVENTIL HFA;VENTOLIN HFA) 108 (90 BASE) MCG/ACT inhaler Inhale 1 puff into the lungs every 6 (six) hours as needed. For asthma    [provider]  Cyanocobalamin (B-12) 2000 MCG TABS Take 1 tablet by mouth every morning.      [provider]  cyclobenzaprine (FLEXERIL) 10 MG tablet Take 1 tablet (10 mg total) by mouth 2 (two) times daily as needed for muscle spasms. 04/27/17   Kirichenko, Lemont Fillers, PA-C  HYDROcodone-acetaminophen (NORCO/VICODIN) 5-325 MG tablet Take 1-2 tablets by mouth every 4 (four) hours as needed. For pain 10/17/22   Dione Booze, MD  medroxyPROGESTERone (DEPO-PROVERA) 150 MG/ML injection Inject 150 mg into the muscle every 3 (three) months.      [provider]  Multiple Vitamins-Minerals (MULTIVITAMINS THER. W/MINERALS) TABS Take 1 tablet by mouth daily.     [provider]  tamsulosin (FLOMAX) 0.4 MG CAPS capsule Take 1 capsule (0.4 mg total) by mouth daily. 10/17/22   Dione Booze, MD      Allergies    Bee venom, Tomato, Peanut-containing drug products, and Penicillins    Review of Systems   Review of Systems   Cardiovascular:  Positive for chest pain.  All other systems reviewed and are negative.   Physical Exam Updated Vital Signs BP (!) 141/94 (BP Location: Left Arm)   Pulse 88   Temp (!) 97.4 F (36.3 C) (Oral)   Resp 13   Ht 5\' 6"  (1.676 m)   Wt 124.3 kg   SpO2 99%   BMI 44.22 kg/m  Physical Exam Vitals and nursing note reviewed.   43 year old female, appears uncomfortable, but is in no acute distress. Vital signs are significant for mildly elevated blood pressure. Oxygen saturation is 99%, which is normal. Head is normocephalic and atraumatic. PERRLA, EOMI. Oropharynx is clear. Neck is nontender and supple. Lungs are clear without rales, wheezes, or rhonchi. Chest is nontender. Heart has regular rate and rhythm without murmur. Abdomen is soft, flat, with mild epigastric tenderness.  There is no rebound or guarding.  Peristalsis is present. Extremities have no cyanosis or edema, full range of motion is present. Skin is warm and dry without rash. Neurologic: Mental status is normal, cranial nerves are intact, moves all extremities equally.  ED Results / Procedures / Treatments   Labs (all labs ordered are listed, but only abnormal results are displayed) Labs Reviewed  CBC WITH DIFFERENTIAL/PLATELET  COMPREHENSIVE METABOLIC PANEL  LIPASE, BLOOD  HCG, SERUM, QUALITATIVE  TROPONIN I (HIGH SENSITIVITY)    EKG EKG Interpretation Date/Time:  Sunday October 10 2023 06:01:17 EDT  Ventricular Rate:  88 PR Interval:  214 QRS Duration:  101 QT Interval:  389 QTC Calculation: 471 R Axis:   6  Text Interpretation: Sinus rhythm Prolonged PR interval ST elev, probable normal early repol pattern No old tracing to compare Confirmed by Dione Booze ([id number]) on 10/10/2023 6:04:52 AM  Radiology No results found.  Procedures Procedures    Medications Ordered in ED Medications  sodium chloride 0.9 % bolus 1,000 mL (has no administration in time range)  ondansetron (ZOFRAN)  injection 4 mg (has no administration in time range)  morphine (PF) 4 MG/ML injection 4 mg (has no administration in time range)  loperamide (IMODIUM) capsule 4 mg (has no administration in time range)    ED Course/ Medical Decision Making/ A&P                                 Medical Decision Making Amount and/or Complexity of Data Reviewed Labs: ordered.  Risk Prescription drug management.   Nausea, vomiting, diarrhea most compatible with viral gastroenteritis, consider food poisoning.  Doubt bowel obstruction, diverticulitis, peptic ulcer disease, cholecystitis, pancreatitis.  I have ordered IV fluids, ondansetron for nausea, morphine for pain, loperamide for diarrhea.  I have ordered screening labs of CBC, comprehensive metabolic panel, lipase, troponin x 1.  She feels significantly better following above-noted medication.  I have reviewed her laboratory tests which have come back, and my interpretation is normal CBC.  Other labs are pending.  Case is signed out to Dr. Andria Meuse.  Final Clinical Impression(s) / ED Diagnoses Final diagnoses:  Nausea vomiting and diarrhea  Upper abdominal pain    Rx / DC Orders ED Discharge Orders     None         Dione Booze, MD 10/10/23 [phone]

## 2023-10-10 NOTE — Discharge Instructions (Addendum)
 While you were in the emergency room, you have blood work done that was normal.  Your EKG was also normal.  Based on your symptoms, you likely have a viral GI bug.  The symptoms usually improve over 48 to 72 hours.  I have sent your prescription for Zofran which is a nausea medicine that you can take every 8 hours.  You not to take Imodium 2 times per day.  Follow-up with your PCP.
